# Patient Record
Sex: Female | Born: 1965 | Race: White | Hispanic: No | Marital: Married | Smoking: Never smoker
Health system: Southern US, Community
[De-identification: ages and names within clinical notes are randomized; demographics above are authoritative.]

## PROBLEM LIST (undated history)

## (undated) DIAGNOSIS — E7212 Methylenetetrahydrofolate reductase deficiency: Secondary | ICD-10-CM

## (undated) DIAGNOSIS — M797 Fibromyalgia: Secondary | ICD-10-CM

## (undated) DIAGNOSIS — E7410 Disorder of fructose metabolism, unspecified: Secondary | ICD-10-CM

## (undated) DIAGNOSIS — M249 Joint derangement, unspecified: Secondary | ICD-10-CM

## (undated) DIAGNOSIS — E7211 Homocystinuria: Secondary | ICD-10-CM

## (undated) DIAGNOSIS — M1990 Unspecified osteoarthritis, unspecified site: Secondary | ICD-10-CM

## (undated) DIAGNOSIS — K6389 Other specified diseases of intestine: Secondary | ICD-10-CM

## (undated) DIAGNOSIS — K6289 Other specified diseases of anus and rectum: Secondary | ICD-10-CM

## (undated) DIAGNOSIS — I890 Lymphedema, not elsewhere classified: Secondary | ICD-10-CM

## (undated) DIAGNOSIS — Q279 Congenital malformation of peripheral vascular system, unspecified: Secondary | ICD-10-CM

## (undated) DIAGNOSIS — C50919 Malignant neoplasm of unspecified site of unspecified female breast: Secondary | ICD-10-CM

## (undated) DIAGNOSIS — E039 Hypothyroidism, unspecified: Secondary | ICD-10-CM

## (undated) DIAGNOSIS — K638219 Small intestinal bacterial overgrowth, unspecified: Secondary | ICD-10-CM

## (undated) HISTORY — DX: Other specified diseases of intestine: K63.89

## (undated) HISTORY — PX: KNEE SURGERY: SHX244

## (undated) HISTORY — DX: Other specified diseases of anus and rectum: K62.89

## (undated) HISTORY — PX: OTHER SURGICAL HISTORY: SHX169

## (undated) HISTORY — DX: Small intestinal bacterial overgrowth, unspecified: K63.8219

## (undated) HISTORY — PX: TONSILLECTOMY: SUR1361

## (undated) HISTORY — DX: Homocystinuria: E72.11

## (undated) HISTORY — DX: Disorder of fructose metabolism, unspecified: E74.10

## (undated) HISTORY — DX: Homocystinuria: E72.12

## (undated) HISTORY — DX: Congenital malformation of peripheral vascular system, unspecified: Q27.9

## (undated) HISTORY — PX: CYSTOCELE REPAIR: SHX163

## (undated) HISTORY — PX: RECTOCELE REPAIR: SHX761

---

## 2011-06-26 ENCOUNTER — Emergency Department (HOSPITAL_COMMUNITY): Payer: BC Managed Care – PPO

## 2011-06-26 ENCOUNTER — Emergency Department (HOSPITAL_COMMUNITY)
Admission: EM | Admit: 2011-06-26 | Discharge: 2011-06-26 | Disposition: A | Discharge status: Home / Self Care | Payer: BC Managed Care – PPO | Attending: Emergency Medicine | Admitting: Emergency Medicine

## 2011-06-26 ENCOUNTER — Encounter: Payer: Self-pay | Admitting: Nurse Practitioner

## 2011-06-26 DIAGNOSIS — W19XXXA Unspecified fall, initial encounter: Secondary | ICD-10-CM

## 2011-06-26 DIAGNOSIS — IMO0001 Reserved for inherently not codable concepts without codable children: Secondary | ICD-10-CM | POA: Insufficient documentation

## 2011-06-26 DIAGNOSIS — W108XXA Fall (on) (from) other stairs and steps, initial encounter: Secondary | ICD-10-CM | POA: Insufficient documentation

## 2011-06-26 DIAGNOSIS — E039 Hypothyroidism, unspecified: Secondary | ICD-10-CM | POA: Insufficient documentation

## 2011-06-26 DIAGNOSIS — S5000XA Contusion of unspecified elbow, initial encounter: Secondary | ICD-10-CM | POA: Insufficient documentation

## 2011-06-26 DIAGNOSIS — M533 Sacrococcygeal disorders, not elsewhere classified: Secondary | ICD-10-CM | POA: Insufficient documentation

## 2011-06-26 DIAGNOSIS — M25529 Pain in unspecified elbow: Secondary | ICD-10-CM | POA: Insufficient documentation

## 2011-06-26 DIAGNOSIS — S5001XA Contusion of right elbow, initial encounter: Secondary | ICD-10-CM

## 2011-06-26 DIAGNOSIS — Z79899 Other long term (current) drug therapy: Secondary | ICD-10-CM | POA: Insufficient documentation

## 2011-06-26 DIAGNOSIS — S300XXA Contusion of lower back and pelvis, initial encounter: Secondary | ICD-10-CM | POA: Insufficient documentation

## 2011-06-26 DIAGNOSIS — M129 Arthropathy, unspecified: Secondary | ICD-10-CM | POA: Insufficient documentation

## 2011-06-26 DIAGNOSIS — IMO0002 Reserved for concepts with insufficient information to code with codable children: Secondary | ICD-10-CM | POA: Insufficient documentation

## 2011-06-26 HISTORY — DX: Fibromyalgia: M79.7

## 2011-06-26 HISTORY — DX: Joint derangement, unspecified: M24.9

## 2011-06-26 HISTORY — DX: Unspecified osteoarthritis, unspecified site: M19.90

## 2011-06-26 HISTORY — DX: Hypothyroidism, unspecified: E03.9

## 2011-06-26 MED ORDER — HYDROCODONE-ACETAMINOPHEN 5-325 MG PO TABS
1.0000 | ORAL_TABLET | Freq: Once | ORAL | Status: AC
  Administered 2011-06-26: 1 via ORAL
  Filled 2011-06-26: qty 1

## 2011-06-26 MED ORDER — HYDROCODONE-ACETAMINOPHEN 5-325 MG PO TABS: 1.0000 | ORAL_TABLET | ORAL | Status: AC | PRN

## 2011-06-26 NOTE — ED Notes (Signed)
Pt fell down approx 5 wooden stairs prior to arrival, c/o pelvic, elbow, and lower back pain since. Ambulatory but tearful.

## 2011-06-26 NOTE — ED Provider Notes (Signed)
History     CSN: [id number] Arrival date & time: 06/26/2011  3:22 PM   First MD Initiated Contact with Patient 06/26/11 1553      Chief Complaint  Patient presents with  . Fall    (Consider location/radiation/quality/duration/timing/severity/associated sxs/prior treatment) HPI History provided by pt.   Pt was walking down wooden staircase w/ her hands full, slipped and fell on her buttocks and went down 5 steps.  Did not hit her head.  C/o pain in elbows, tailbone and right buttock.  Has taken 2 advil w/ some relief.  She is ambulatory.  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  It dislocated when she fell but was able to reduce w/ help of her daughter.    Past Medical History  Diagnosis Date  . Hypermobile joints   . Arthritis   . Fibromyalgia   . Hypothyroid   . Sleep apnea     History reviewed. No pertinent past surgical history.  History reviewed. No pertinent family history.  History  Substance Use Topics  . Smoking status: Never Smoker   . Smokeless tobacco: Not on file  . Alcohol Use: No    OB History    Grav Para Term Preterm Abortions TAB SAB Ect Mult Living                  Review of Systems  All other systems reviewed and are negative.    Allergies  Aspirin; Benadryl; Compazine; Dairy aid; Gentamycin; Milk-related compounds; Penicillins; Sulfa antibiotics; Versed; and Feldene  Home Medications   Current Outpatient Rx  Name Route Sig Dispense Refill  . CALCIUM + D PO Oral Take 1 tablet by mouth daily.      Marland Kitchen VITAMIN B-12 1000 MCG SL SUBL Sublingual Place 1 tablet under the tongue once a week.      . CYCLOBENZAPRINE HCL 10 MG PO TABS Oral Take 10 mg by mouth daily as needed.      Marland Kitchen LEVOTHYROXINE SODIUM 75 MCG PO TABS Oral Take 75-112 mcg by mouth daily. 1 1/2 tablet on Mondays, 1 tablet every other day of the week     . THERA M PLUS PO TABS Oral Take 1 tablet by mouth daily. Women's one a day with iron     .  POLYETHYLENE GLYCOL 3350 PO PACK Oral Take 17 g by mouth daily.      . ALBUTEROL SULFATE HFA 108 (90 BASE) MCG/ACT IN AERS Inhalation Inhale 2 puffs into the lungs every 4 (four) hours as needed.        BP 122/83  Pulse 85  Temp(Src) 98 F (36.7 C) (Oral)  Resp 20  Ht 5\' 4"  (1.626 m)  Wt 171 lb (77.565 kg)  BMI 29.35 kg/m2  SpO2 97%  Physical Exam  Nursing note and vitals reviewed. Constitutional: She is oriented to person, place, and time. She appears well-developed and well-nourished. No distress.  HENT:  Head: Normocephalic and atraumatic.  Eyes:       Normal appearance  Neck: Normal range of motion.  Musculoskeletal:       Left and right posterior elbows w/ superficial, hemostatic abrasions.  Tenderness of bilateral medial epicondyles.  Full active ROM of left elbow w/out pain.  Right elbow pain w/ pronation.  Pelvis stable.  Spine non-tender.  Sacrum, coccyx and bilateral SI joints ttp.  Pain in right buttock w/ flexion of both hips.  No pain w/ internal/external rotation of hips.  2+ DP pulses and intact sensation bilaterally.  Neurological: She is alert and oriented to person, place, and time.  Psychiatric: She has a normal mood and affect. Her behavior is normal.    ED Course  Procedures (including critical care time)  Labs Reviewed - No data to display Dg Pelvis 1-2 Views  06/26/2011  *RADIOLOGY REPORT*  Clinical Data: Larey Seat down a flight of stairs, pain at pelvis  PELVIS - 1-2 VIEW  Comparison: None  Findings: IUD projects over sacrum. Hip and SI joints symmetric. Osseous mineralization normal. No acute fracture, dislocation or bone destruction.  IMPRESSION: No acute bony abnormalities.  Original Report Authenticated By: Lollie Marrow, M.D.   Dg Sacrum/coccyx  06/26/2011  *RADIOLOGY REPORT*  Clinical Data: Pelvic pain, fell down stairs  SACRUM AND COCCYX - 2+ VIEW  Comparison: None  Findings: IUD projects over sacrum. Symmetric hip and SI joints. Osseous mineralization  normal. Sacral neural foramen appear grossly symmetric. No acute fracture, dislocation, or bone destruction.  IMPRESSION: No acute bony abnormalities.  Original Report Authenticated By: Lollie Marrow, M.D.   Dg Elbow Complete Right  06/26/2011  *RADIOLOGY REPORT*  Clinical Data: Right elbow pain and abrasions, fell down stairs  RIGHT ELBOW - COMPLETE 3+ VIEW  Comparison: None  Findings: Bone mineralization normal. Joint spaces preserved. No fracture, dislocation, or bone destruction. No joint effusion.  IMPRESSION: Normal exam.  Original Report Authenticated By: Lollie Marrow, M.D.     1. Fall   2. Contusion of coccyx   3. Contusion of right elbow       MDM  Pt has right SI joint laxity.  Had a mechanical fall on buttocks today, SI joint dislocated and she and her daughter reduced at home.  Comes to ED because worse than nml pain at right SI joint as well as tailbone and right elbow.  Xrays neg for fx/dislocation and SI joints appear symmetrical.  Results discussed w/ pt.  She has received 1 po vicodin and reports improvement in pain.  She asked me to contact her PTs in [place] and request f/u for Monday.  Two PT offices called and messages left.  Xray results printed for pt.  Discharged home w/ vicodin for pain.        Otilio Miu, [place] 06/26/11 1824

## 2011-06-26 NOTE — ED Provider Notes (Signed)
Medical screening examination/treatment/procedure(s) were performed by non-physician practitioner and as supervising physician I was immediately available for consultation/collaboration.   Laray Anger, DO 06/26/11 2211

## 2011-06-26 NOTE — ED Notes (Signed)
Patient transported to X-ray 

## 2011-06-26 NOTE — ED Notes (Signed)
Pt c/o of neck pain when radiology tech went in room, [place], [place] made aware and does not want cs films done.

## 2014-08-03 ENCOUNTER — Encounter (HOSPITAL_COMMUNITY): Payer: Self-pay | Admitting: *Deleted

## 2014-08-03 ENCOUNTER — Inpatient Hospital Stay (HOSPITAL_COMMUNITY)
Admission: AD | Admit: 2014-08-03 | Discharge: 2014-08-03 | Disposition: A | Discharge status: Home / Self Care | Payer: PRIVATE HEALTH INSURANCE | Source: Ambulatory Visit | Attending: Obstetrics and Gynecology | Admitting: Obstetrics and Gynecology

## 2014-08-03 DIAGNOSIS — R338 Other retention of urine: Secondary | ICD-10-CM | POA: Diagnosis not present

## 2014-08-03 DIAGNOSIS — R339 Retention of urine, unspecified: Secondary | ICD-10-CM | POA: Diagnosis not present

## 2014-08-03 DIAGNOSIS — K5900 Constipation, unspecified: Secondary | ICD-10-CM

## 2014-08-03 DIAGNOSIS — E039 Hypothyroidism, unspecified: Secondary | ICD-10-CM | POA: Diagnosis not present

## 2014-08-03 DIAGNOSIS — N9989 Other postprocedural complications and disorders of genitourinary system: Secondary | ICD-10-CM | POA: Diagnosis not present

## 2014-08-03 DIAGNOSIS — M797 Fibromyalgia: Secondary | ICD-10-CM | POA: Diagnosis not present

## 2014-08-03 LAB — URINALYSIS, ROUTINE W REFLEX MICROSCOPIC
Bilirubin Urine: NEGATIVE
GLUCOSE, UA: NEGATIVE mg/dL
Ketones, ur: NEGATIVE mg/dL
LEUKOCYTES UA: NEGATIVE
Nitrite: NEGATIVE
PH: 7.5 (ref 5.0–8.0)
PROTEIN: NEGATIVE mg/dL
SPECIFIC GRAVITY, URINE: 1.01 (ref 1.005–1.030)
Urobilinogen, UA: 0.2 mg/dL (ref 0.0–1.0)

## 2014-08-03 LAB — URINE MICROSCOPIC-ADD ON

## 2014-08-03 MED ORDER — ONDANSETRON HCL 4 MG PO TABS
8.0000 mg | ORAL_TABLET | Freq: Once | ORAL | Status: AC
  Administered 2014-08-03: 8 mg via ORAL
  Filled 2014-08-03: qty 2

## 2014-08-03 NOTE — MAU Note (Signed)
Pt had surgery Monday in [place] and went home with catheter. Cath taken out Thursday. Has not had bowel movement since Sunday. Still having trouble urinating and has burning with urination.

## 2014-08-03 NOTE — Discharge Instructions (Signed)
Acute Urinary Retention °Acute urinary retention is the temporary inability to urinate. This is an uncommon problem in women. It can be caused by: °· Infection. °· A side effect of a medicine. °· A problem in a nearby organ that presses or squeezes on the bladder or the urethra (the tube that drains the bladder). °· Psychological problems. °·  Surgery on your bladder, urethra, or pelvic organs that causes obstruction to the outflow of urine from your bladder. °HOME CARE INSTRUCTIONS  °If you are sent home with a Foley catheter and a drainage system, you will need to discuss the best course of action with your health care provider. While the catheter is in, maintain a good intake of fluids. Keep the drainage bag emptied and lower than your catheter. This is so that contaminated urine will not flow back into your bladder, which could lead to a urinary tract infection. °There are two main types of drainage bags. One is a large bag that usually is used at night. It has a good capacity that will allow you to sleep through the night without having to empty it. The second type is called a leg bag. It has a smaller capacity so it needs to be emptied more frequently. However, the main advantage is that it can be attached by a leg strap and goes underneath your clothing, allowing you the freedom to move about or leave your home. °Only take over-the-counter or prescription medicines for pain, discomfort, or fever as directed by your health care provider.  °SEEK MEDICAL CARE IF: °· You develop a low-grade fever. °· You experience spasms or leakage of urine with the spasms. °SEEK IMMEDIATE MEDICAL CARE IF:  °· You develop chills or fever. °· Your catheter stops draining urine. °· Your catheter falls out. °· You start to develop increased bleeding that does not respond to rest and increased fluid intake. °MAKE SURE YOU: °· Understand these instructions. °· Will watch your condition. °· Will get help right away if you are not  doing well or get worse. °Document Released: 07/18/2006 Document Revised: 05/09/2013 Document Reviewed: 12/28/2012 °ExitCare® Patient Information ©2015 [employer]. This information is not intended to replace advice given to you by your health care provider. Make sure you discuss any questions you have with your health care provider. ° °

## 2014-08-03 NOTE — MAU Note (Signed)
Pt up to bathroom, voided 200 cc clear yellow urine

## 2014-08-03 NOTE — MAU Provider Note (Signed)
History     CSN: [id number]  Arrival date and time: 08/03/14 1341   First Provider Initiated Contact with Patient 08/03/14 1532      No chief complaint on file.  HPI Vanessa Porter 49 y.o. had Cystocele, Rectocele, Anterior Colporraphy, Posterior Coplorrhaphy were perfomed on 07/29/14 at [facility] in [place].  Since that time she has had difficulty urinating and having bowel movements since that time.  She is to see Dr. Deneise Lever on 08/06/14 and Dr. Windell Norfolk talked to her today by phone.  Dr. Marylou Mccoy advised pt to be seen at Urgent Care for catheterization and if greater 250cc residual volume, she would like a foley cath to be left in.  She has also asked for urine culture.  She has asked to be called to discuss: [phone] Vanessa Porter).  Since surgery she has taken Miralax twice daily, used Dulcolax suppository once on 12/31.  She has had a very small amount of stool leave her.   She denies weakness, dizziness, headache, shortness of breath, calf pain.  She has nausea with use of pain medication.   OB History    Gravida Para Term Preterm AB TAB SAB Ectopic Multiple Living   3 3        3       Past Medical History  Diagnosis Date  . Hypermobile joints   . Arthritis   . Fibromyalgia   . Hypothyroid     Past Surgical History  Procedure Laterality Date  . Rectocele repair    . Vaginal sling    . Cystocele repair    . Knee surgery    . Tonsillectomy      History reviewed. No pertinent family history.  History  Substance Use Topics  . Smoking status: Never Smoker   . Smokeless tobacco: Never Used  . Alcohol Use: No    Allergies:  Allergies  Allergen Reactions  . Aspirin Anaphylaxis  . Benadryl [Diphenhydramine Hcl] Anaphylaxis  . Clindamycin/Lincomycin Anaphylaxis  . Compazine Anaphylaxis  . Fructose Anaphylaxis  . Midazolam Hcl Anaphylaxis  . Amino Acids Other (See Comments)    Pt is avoiding these in her diet.   . Dairy Aid [Lactase] Other (See Comments)    Reaction:   Stomach upset   . Doxycycline Other (See Comments)    Reaction:  Vision issues   . Indomethacin Other (See Comments)    Reaction:  Headaches that caused white matter in the brain to change.   . Pneumococcal Vaccines Swelling  . Sulfa Antibiotics Hives  . Feldene [Piroxicam] Rash  . Latex Rash    Prescriptions prior to admission  Medication Sig Dispense Refill Last Dose  . amoxicillin-clavulanate (AUGMENTIN) 500-125 MG per tablet Take 1 tablet by mouth 2 (two) times daily. Pt alternates between this and Xifaxan.   Past Month at Unknown time  . bisacodyl (DULCOLAX) 10 MG suppository Place 10 mg rectally as needed for moderate constipation.   08/02/2014 at Unknown time  . cetirizine (ZYRTEC) 10 MG tablet Take 10 mg by mouth daily.   Past Week at Unknown time  . Cholecalciferol (VITAMIN D3) 1000 UNITS CAPS Take 1 capsule by mouth daily.   Past Month at Unknown time  . fluticasone (FLOVENT HFA) 44 MCG/ACT inhaler Inhale 1 puff into the lungs 2 (two) times daily as needed (for shortness of breath).   rescue  . ibuprofen (ADVIL,MOTRIN) 600 MG tablet Take 600 mg by mouth every 6 (six) hours as needed for mild pain.   08/03/2014  at 1200  . Levonorgestrel (MIRENA IU) 1 each by Intrauterine route once.   continuous  . levothyroxine (SYNTHROID, LEVOTHROID) 88 MCG tablet Take 88 mcg by mouth daily before breakfast.   08/03/2014 at Unknown time  . metaxalone (SKELAXIN) 800 MG tablet Take 800 mg by mouth 2 (two) times daily as needed for muscle spasms.   08/02/2014 at Unknown time  . mometasone (NASONEX) 50 MCG/ACT nasal spray Place 2 sprays into the nose 2 (two) times daily.   08/02/2014 at Unknown time  . Multiple Vitamins-Minerals (MULTIVITAMIN & MINERAL PO) Take 1 tablet by mouth daily.   08/02/2014 at Unknown time  . ondansetron (ZOFRAN) 4 MG tablet Take 4 mg by mouth every 8 (eight) hours as needed for nausea or vomiting.   08/03/2014 at Unknown time  . oxyCODONE-acetaminophen (PERCOCET/ROXICET) 5-325 MG per  tablet Take 1 tablet by mouth every 6 (six) hours as needed for severe pain.   08/03/2014 at 0830  . polyethylene glycol powder (GLYCOLAX/MIRALAX) powder Take 17 g by mouth 2 (two) times daily.   08/02/2014 at Unknown time  . rifaximin (XIFAXAN) 550 MG TABS tablet Take 550 mg by mouth 2 (two) times daily. Pt alternates between this and Augmentin.     Marland Kitchen saccharomyces boulardii (FLORASTOR) 250 MG capsule Take 250 mg by mouth daily.   08/03/2014 at Unknown time  . albuterol (PROVENTIL HFA;VENTOLIN HFA) 108 (90 BASE) MCG/ACT inhaler Inhale 2 puffs into the lungs every 6 (six) hours as needed for wheezing or shortness of breath.    rescue    ROS Pertinent ROS in HPI Physical Exam   There were no vitals taken for this visit.  Physical Exam  Constitutional: She is oriented to person, place, and time. She appears well-developed and well-nourished.  HENT:  Head: Normocephalic and atraumatic.  Eyes: EOM are normal.  Neck: Normal range of motion. Neck supple.  Cardiovascular: Normal rate and regular rhythm.   GI: Bowel sounds are normal. She exhibits distension. There is tenderness. There is no rebound and no guarding.  Musculoskeletal: Normal range of motion.  Neurological: She is alert and oriented to person, place, and time.  Skin: Skin is warm and dry.  Psychiatric: She has a normal mood and affect.   Results for orders placed or performed during the hospital encounter of 08/03/14 (from the past 24 hour(s))  Urinalysis, Routine w reflex microscopic     Status: Abnormal   Collection Time: 08/03/14  2:36 PM  Result Value Ref Range   Color, Urine YELLOW YELLOW   APPearance CLEAR CLEAR   Specific Gravity, Urine 1.010 1.005 - 1.030   pH 7.5 5.0 - 8.0   Glucose, UA NEGATIVE NEGATIVE mg/dL   Hgb urine dipstick TRACE (A) NEGATIVE   Bilirubin Urine NEGATIVE NEGATIVE   Ketones, ur NEGATIVE NEGATIVE mg/dL   Protein, ur NEGATIVE NEGATIVE mg/dL   Urobilinogen, UA 0.2 0.0 - 1.0 mg/dL   Nitrite NEGATIVE  NEGATIVE   Leukocytes, UA NEGATIVE NEGATIVE  Urine microscopic-add on     Status: Abnormal   Collection Time: 08/03/14  2:36 PM  Result Value Ref Range   WBC, UA 0-2 <3 WBC/hpf   RBC / HPF 0-2 <3 RBC/hpf   Bacteria, UA FEW (A) RARE    MAU Course  Procedures  MDM Discussed with Dr. Elly Modena whom advises for Foley Cath after void.  If >250cc okay to leave in place for discharge and close follow up in clinic asap with her surgeon.  Additionally, Dr.  Constant advises would not be any more aggressive with constipation management but rather to minimize pain medication as much as able.   Post Void cath residual of 650cc.  Pt to be discharged with foley in place.  Assessment and Plan  A: Post op urinary retention Post op Constipation complicated by pain medication  P: Discharge to home Follow up in clinic asap Continue meds prescribed post op in [place] Return to ED for worsening of sxs   Paticia Stack 08/03/2014, 3:38 PM

## 2014-08-04 ENCOUNTER — Encounter (HOSPITAL_COMMUNITY): Payer: Self-pay | Admitting: *Deleted

## 2014-08-04 ENCOUNTER — Emergency Department (HOSPITAL_COMMUNITY)
Admission: EM | Admit: 2014-08-04 | Discharge: 2014-08-04 | Disposition: A | Discharge status: Home / Self Care | Payer: PRIVATE HEALTH INSURANCE | Attending: Emergency Medicine | Admitting: Emergency Medicine

## 2014-08-04 DIAGNOSIS — R42 Dizziness and giddiness: Secondary | ICD-10-CM | POA: Diagnosis not present

## 2014-08-04 DIAGNOSIS — Z7952 Long term (current) use of systemic steroids: Secondary | ICD-10-CM | POA: Diagnosis not present

## 2014-08-04 DIAGNOSIS — Z9889 Other specified postprocedural states: Secondary | ICD-10-CM | POA: Diagnosis not present

## 2014-08-04 DIAGNOSIS — Z8739 Personal history of other diseases of the musculoskeletal system and connective tissue: Secondary | ICD-10-CM | POA: Insufficient documentation

## 2014-08-04 DIAGNOSIS — Z792 Long term (current) use of antibiotics: Secondary | ICD-10-CM | POA: Diagnosis not present

## 2014-08-04 DIAGNOSIS — Z91040 Latex allergy status: Secondary | ICD-10-CM | POA: Diagnosis not present

## 2014-08-04 DIAGNOSIS — Z978 Presence of other specified devices: Secondary | ICD-10-CM

## 2014-08-04 DIAGNOSIS — R51 Headache: Secondary | ICD-10-CM | POA: Diagnosis not present

## 2014-08-04 DIAGNOSIS — Z79899 Other long term (current) drug therapy: Secondary | ICD-10-CM | POA: Diagnosis not present

## 2014-08-04 DIAGNOSIS — R358 Other polyuria: Secondary | ICD-10-CM | POA: Insufficient documentation

## 2014-08-04 DIAGNOSIS — R3589 Other polyuria: Secondary | ICD-10-CM

## 2014-08-04 DIAGNOSIS — E039 Hypothyroidism, unspecified: Secondary | ICD-10-CM | POA: Diagnosis not present

## 2014-08-04 DIAGNOSIS — Z960 Presence of urogenital implants: Secondary | ICD-10-CM

## 2014-08-04 LAB — CBC WITH DIFFERENTIAL/PLATELET
BASOS ABS: 0 10*3/uL (ref 0.0–0.1)
Basophils Relative: 0 % (ref 0–1)
EOS PCT: 7 % — AB (ref 0–5)
Eosinophils Absolute: 0.6 10*3/uL (ref 0.0–0.7)
HCT: 35.8 % — ABNORMAL LOW (ref 36.0–46.0)
Hemoglobin: 11.8 g/dL — ABNORMAL LOW (ref 12.0–15.0)
LYMPHS PCT: 24 % (ref 12–46)
Lymphs Abs: 2.1 10*3/uL (ref 0.7–4.0)
MCH: 29.6 pg (ref 26.0–34.0)
MCHC: 33 g/dL (ref 30.0–36.0)
MCV: 89.9 fL (ref 78.0–100.0)
Monocytes Absolute: 0.5 10*3/uL (ref 0.1–1.0)
Monocytes Relative: 5 % (ref 3–12)
NEUTROS PCT: 64 % (ref 43–77)
Neutro Abs: 5.8 10*3/uL (ref 1.7–7.7)
PLATELETS: 294 10*3/uL (ref 150–400)
RBC: 3.98 MIL/uL (ref 3.87–5.11)
RDW: 13 % (ref 11.5–15.5)
WBC: 9 10*3/uL (ref 4.0–10.5)

## 2014-08-04 LAB — BASIC METABOLIC PANEL
ANION GAP: 5 (ref 5–15)
BUN: 13 mg/dL (ref 6–23)
CALCIUM: 9.5 mg/dL (ref 8.4–10.5)
CO2: 29 mmol/L (ref 19–32)
Chloride: 105 mEq/L (ref 96–112)
Creatinine, Ser: 0.71 mg/dL (ref 0.50–1.10)
GFR calc Af Amer: >90 mL/min (ref >90)
Glucose, Bld: 109 mg/dL — ABNORMAL HIGH (ref 70–99)
POTASSIUM: 3.6 mmol/L (ref 3.5–5.1)
SODIUM: 139 mmol/L (ref 135–145)

## 2014-08-04 MED ORDER — SODIUM CHLORIDE 0.9 % IV BOLUS (SEPSIS): 1000.0000 mL | Freq: Once | INTRAVENOUS | Status: DC

## 2014-08-04 MED ORDER — METOCLOPRAMIDE HCL 10 MG PO TABS: 10.0000 mg | ORAL_TABLET | Freq: Four times a day (QID) | ORAL | Status: DC

## 2014-08-04 MED ORDER — METOCLOPRAMIDE HCL 5 MG/ML IJ SOLN
10.0000 mg | Freq: Once | INTRAMUSCULAR | Status: AC
  Administered 2014-08-04: 10 mg via INTRAMUSCULAR
  Filled 2014-08-04: qty 2

## 2014-08-04 MED ORDER — SODIUM CHLORIDE 0.9 % IV BOLUS (SEPSIS)
1000.0000 mL | Freq: Once | INTRAVENOUS | Status: AC
  Administered 2014-08-04: 1000 mL via INTRAVENOUS

## 2014-08-04 MED ORDER — METOCLOPRAMIDE HCL 5 MG/ML IJ SOLN
10.0000 mg | Freq: Once | INTRAMUSCULAR | Status: DC
  Filled 2014-08-04: qty 2

## 2014-08-04 MED ORDER — MORPHINE SULFATE 4 MG/ML IJ SOLN
4.0000 mg | Freq: Once | INTRAMUSCULAR | Status: AC
  Administered 2014-08-04: 4 mg via INTRAVENOUS
  Filled 2014-08-04: qty 1

## 2014-08-04 MED ORDER — ONDANSETRON HCL 4 MG/2ML IJ SOLN
4.0000 mg | INTRAMUSCULAR | Status: AC
  Administered 2014-08-04: 4 mg via INTRAVENOUS
  Filled 2014-08-04: qty 2

## 2014-08-04 NOTE — ED Notes (Signed)
Pt assisted with getting pants on by this RN. Catheter securement also changed per patients request. She was assisted to car by NT Edison Nasuti where a friend is driving her home. Patient was advised to follow up with PCP in 2 days and keep all other follow up appointments.

## 2014-08-04 NOTE — ED Notes (Signed)
Pt states she has taken in 600cc fluid but put out over 3000.

## 2014-08-04 NOTE — ED Notes (Signed)
Bladder sling and cystocele repair done Monday 12/28 Went back for f/u on Weds. (voiding study where foley was removed) 12/30 Decreased output since foley taken out, so patient was told to go to Urgent Care and then UC told to go to [facility]  At [facility], another foley cath was placed (08/03/2014) Patient now here in ED because she believes that "too much urine is coming out, that she is going to get dehydrated and her electrolytes are going to be messed up"

## 2014-08-04 NOTE — ED Notes (Signed)
Pt had bladder surgery on Monday at [facility]. Pt in today b/c she states she is voiding too much.

## 2014-08-04 NOTE — ED Notes (Signed)
Bed: AP01 Expected date:  Expected time:  Means of arrival:  Comments: Bed 5, EMS, 87 F, Excessive Urine Output

## 2014-08-04 NOTE — Discharge Instructions (Signed)
Urethral Vaginal Sling °A urethral vaginal sling procedure is surgery to correct urinary incontinence. Urinary incontinence is uncontrolled loss of urine. It is common in women who have had children and in older women. In this surgery, a strong piece of material is placed under the tube that drains the bladder (urethra). This sling is made of tension-free vaginal tape or nylon mesh. It fits under the urethra like a hammock. The sling is put in position to straighten, support, and hold the urethra in its normal position.  °LET YOUR HEALTH CARE PROVIDER KNOW ABOUT:  °· Any allergies you have. °· All medicines you are taking, including vitamins, herbs, eye drops, creams, and over-the-counter medicines. °· Previous problems you or members of your family have had with the use of anesthetics. °· Any blood disorders you have. °· Previous surgeries you have had. °· Medical conditions you have. °RISKS AND COMPLICATIONS  °Generally, this is a safe procedure. However, as with any procedure, complications can occur. Possible complications include: °· Infection. °· Excessive bleeding. °· Damage to other organs. °· Problems urinating properly for several days or weeks. °· Problems from the use of anesthetics. °· Return of the urinary incontinence. °BEFORE THE PROCEDURE  °· Ask your health care provider about changing or stopping your regular medicines. You may need to stop taking certain medicines 1 week before the surgery. °· Do not eat or drink anything for 6-8 hours before the surgery. °· If you smoke, do not smoke for at least 2 weeks before the surgery. °· Make plans to have someone drive you home after your hospital stay. Also arrange for someone to help you with activities during recovery. °PROCEDURE  °· You will have general or spinal anesthesia. With general anesthesia, you are asleep and will feel no pain. With spinal anesthesia, you are numb from the waist down, but you will still be awake. °· A catheter is placed in  your bladder to drain urine during the procedure. °· An incision is made in your vagina and low on your belly in the hairline. °· The sling material is passed around your bladder neck and sutured to the muscles to hold the urethra in its normal position. °· The incisions are closed. °AFTER THE PROCEDURE  °· You will be taken to a recovery area where your progress will be monitored closely. Your breathing, blood pressure, and pulse (vital signs) will be checked often. When you are stable, you will be moved to a regular hospital room. °· You will have a catheter in place to drain your bladder. This will stay in place until your bladder is working properly on its own. °· You may have a gauze packing in the vagina to prevent bleeding. This will be removed in 1-2 days. °· You will likely need to stay in the hospital for 2-3 days. °Document Released: 04/27/2008 Document Revised: 05/09/2013 Document Reviewed: 01/05/2013 °ExitCare® Patient Information ©2015 [employer]. This information is not intended to replace advice given to you by your health care provider. Make sure you discuss any questions you have with your health care provider. ° °

## 2014-08-04 NOTE — ED Notes (Signed)
Pt also c/o nausea and a h/a

## 2014-08-04 NOTE — ED Provider Notes (Signed)
CSN: [id number]     Arrival date & time 08/04/14  0215 History   First MD Initiated Contact with Patient 08/04/14 0308     Chief Complaint  Patient presents with  . Polyuria     (Consider location/radiation/quality/duration/timing/severity/associated sxs/prior Treatment) HPI Comments: Patient is a 49 year old female who presents to the emergency department for further evaluation of polyuria. Patient states that she had a rectocele and cystocele as well as incontinence and, therefore, had bladder sling procedure completed on 07/29/2014 by Dr. Windell Norfolk and Dr. Deneise Lever. Surgery was performed at [facility]. Patient states that she couldn't void after surgery requiring subsequent placement of a Foley catheter. Patient states that she had her Foley catheter removed after bladder challenge on 07/31/2014. She states that she noticed increasing abdominal distention with difficulty voiding over the last few days. She called her doctor who recommended that she go to the emergency department for evaluation. Patient presented to [facility] yesterday and was found to have a PVD of 680cc. A new Foley catheter was placed at this time. Patient was not started on antibiotics as urinalysis did not suggest infection. Patient states that she has had approximately 3800cc fluid in her catheter bag since Foley placement. She states that she is concerned about this because she has only drank approximately 800cc of fluid. She states that she has felt intermittently nauseous with a headache and dry heaves. She states that she has persistent abdominal pain since the surgery which is present in her suprapubic region. Patient denies associated fever, syncope, chest pain, and shortness of breath. She has been having some irregular bowel movements and passing flatus. Her last bowel movement was yesterday.  The history is provided by the patient. No language interpreter was used.    Past Medical History  Diagnosis Date  .  Hypermobile joints   . Arthritis   . Fibromyalgia   . Hypothyroid    Past Surgical History  Procedure Laterality Date  . Rectocele repair    . Vaginal sling    . Cystocele repair    . Knee surgery    . Tonsillectomy     History reviewed. No pertinent family history. History  Substance Use Topics  . Smoking status: Never Smoker   . Smokeless tobacco: Never Used  . Alcohol Use: No   OB History    Gravida Para Term Preterm AB TAB SAB Ectopic Multiple Living   3 3        3       Review of Systems  Constitutional: Negative for fever.  Respiratory: Negative for shortness of breath.   Cardiovascular: Negative for chest pain.  Gastrointestinal: Positive for nausea and abdominal pain.  Neurological: Positive for light-headedness and headaches. Negative for syncope.  All other systems reviewed and are negative.   Allergies  Aspirin; Benadryl; Clindamycin/lincomycin; Compazine; Fructose; Midazolam hcl; Amino acids; Dairy aid; Doxycycline; Indomethacin; Pneumococcal vaccines; Sulfa antibiotics; Feldene; and Latex  Home Medications   Prior to Admission medications   Medication Sig Start Date End Date Taking? Authorizing Provider  albuterol (PROVENTIL HFA;VENTOLIN HFA) 108 (90 BASE) MCG/ACT inhaler Inhale 2 puffs into the lungs every 6 (six) hours as needed for wheezing or shortness of breath.    Yes Historical Provider, MD  amoxicillin-clavulanate (AUGMENTIN) 500-125 MG per tablet Take 1 tablet by mouth 2 (two) times daily. Pt alternates between this and xifaxan   Yes Historical Provider, MD  bisacodyl (DULCOLAX) 10 MG suppository Place 10 mg rectally daily as needed for moderate constipation.  08/01/14 08/11/14 Yes Historical Provider, MD  cetirizine (ZYRTEC) 10 MG tablet Take 10 mg by mouth daily.   Yes Historical Provider, MD  Cholecalciferol (VITAMIN D3) 1000 UNITS CAPS Take 1,000 Units by mouth daily.    Yes Historical Provider, MD  fluticasone (FLOVENT HFA) 44 MCG/ACT inhaler  Inhale 1 puff into the lungs 2 (two) times daily as needed (for shortness of breath).   Yes Historical Provider, MD  HYDROcodone-acetaminophen (NORCO) 5-325 MG per tablet Take 1 tablet by mouth every 6 (six) hours as needed for moderate pain.  07/29/14 08/08/14 Yes Historical Provider, MD  ibuprofen (ADVIL,MOTRIN) 600 MG tablet Take 600 mg by mouth every 6 (six) hours as needed for mild pain.   Yes Historical Provider, MD  Levonorgestrel (MIRENA IU) 1 each by Intrauterine route once. 02/14/13  Yes Historical Provider, MD  levothyroxine (SYNTHROID, LEVOTHROID) 88 MCG tablet Take 88 mcg by mouth daily before breakfast.   Yes Historical Provider, MD  metaxalone (SKELAXIN) 800 MG tablet Take 800 mg by mouth 2 (two) times daily as needed for muscle spasms.   Yes Historical Provider, MD  mometasone (NASONEX) 50 MCG/ACT nasal spray Place 2 sprays into the nose 2 (two) times daily.   Yes Historical Provider, MD  Multiple Vitamins-Minerals (MULTIVITAMIN & MINERAL PO) Take 1 tablet by mouth daily.   Yes Historical Provider, MD  ondansetron (ZOFRAN) 4 MG tablet Take 4 mg by mouth every 8 (eight) hours as needed for nausea or vomiting.   Yes Historical Provider, MD  oxyCODONE-acetaminophen (PERCOCET/ROXICET) 5-325 MG per tablet Take 1 tablet by mouth every 6 (six) hours as needed for severe pain.   Yes Historical Provider, MD  polyethylene glycol powder (GLYCOLAX/MIRALAX) powder Take 17 g by mouth 2 (two) times daily.   Yes Historical Provider, MD  rifaximin (XIFAXAN) 550 MG TABS tablet Take 550 mg by mouth 2 (two) times daily. Pt alternates between this and Augmentin.   Yes Historical Provider, MD  saccharomyces boulardii (FLORASTOR) 250 MG capsule Take 250 mg by mouth daily.   Yes Historical Provider, MD  metoCLOPramide (REGLAN) 10 MG tablet Take 1 tablet (10 mg total) by mouth every 6 (six) hours. 08/04/14   Antonietta Breach, PA-C   BP 104/67 mmHg  Pulse 66  Temp(Src) 98.1 F (36.7 C) (Oral)  Resp 20  SpO2 96%   LMP 07/01/2014   Physical Exam  Constitutional: She is oriented to person, place, and time. She appears well-developed and well-nourished. No distress.  Nontoxic/nonseptic appearing  HENT:  Head: Normocephalic and atraumatic.  Eyes: Conjunctivae and EOM are normal. No scleral icterus.  Neck: Normal range of motion.  Pulmonary/Chest: Effort normal. No respiratory distress.  Chest expansion symmetric  Abdominal: Normal appearance and bowel sounds are normal. There is tenderness in the suprapubic area. There is no rebound and no CVA tenderness.    No peritoneal signs or guarding. Abdomen soft.  Musculoskeletal: Normal range of motion.  Neurological: She is alert and oriented to person, place, and time. She exhibits normal muscle tone. Coordination normal.  GCS 15. Speech is goal oriented. Patient moves extremities without ataxia.  Skin: Skin is warm and dry. No rash noted. She is not diaphoretic. No erythema. No pallor.  Psychiatric: She has a normal mood and affect. Her behavior is normal.  Nursing note and vitals reviewed.   ED Course  Procedures (including critical care time) Labs Review Labs Reviewed  CBC WITH DIFFERENTIAL - Abnormal; Notable for the following:    Hemoglobin 11.8 (*)  HCT 35.8 (*)    Eosinophils Relative 7 (*)    All other components within normal limits  BASIC METABOLIC PANEL - Abnormal; Notable for the following:    Glucose, Bld 109 (*)    All other components within normal limits    Imaging Review No results found.   EKG Interpretation None      MDM   Final diagnoses:  Polyuria  Foley catheter in place    49 year old female presents to the emergency department for further evaluation of polyuria. Patient concerned that she has put out about 3800cc of fluid in her foley catheter bag in 12 hours despite only taking in 800cc PO. Patient has no clinical signs of dehydration. No tachycardia or persistent hypotension. Laboratory workup reveals no  leukocytosis. She has a normal kidney function without evidence of AKI. Abdominal exam significant for some mild tenderness in the suprapubic region. Believe this is appropriate given patient's recent surgical procedure. No peritoneal signs, masses, or other evidence of acute surgical abdomen.   I have reviewed the patient's workup with Dr. Windell Norfolk. Dr. Windell Norfolk believes the patient is stable for outpatient follow-up with Dr. Deneise Lever as scheduled. She does not see the need for further emergent workup or imaging at this time. Have reviewed these findings with the patient as well; she verbalizes understanding. Will discharge with prescription for Reglan for persistent nausea if not controlled with Zofran. Return precautions discussed and provided. Patient agreeable to plan with no unaddressed concerns.   Filed Vitals:   08/04/14 0430 08/04/14 0500 08/04/14 0700 08/04/14 0721  BP: 120/78 118/72 99/62 104/67  Pulse: 67 86 59 66  Temp:    98.1 F (36.7 C)  TempSrc:    Oral  Resp:    20  SpO2: 97% 94% 96% 96%     Antonietta Breach, PA-C 08/04/14 [place], MD 08/05/14 [phone]

## 2014-08-04 NOTE — ED Notes (Signed)
Patient is resting comfortably. Warm blanket and pillow given.

## 2019-03-04 ENCOUNTER — Encounter (HOSPITAL_COMMUNITY): Payer: Self-pay | Admitting: Emergency Medicine

## 2019-03-04 ENCOUNTER — Other Ambulatory Visit: Payer: Self-pay

## 2019-03-04 ENCOUNTER — Ambulatory Visit (HOSPITAL_COMMUNITY)
Admission: EM | Admit: 2019-03-04 | Discharge: 2019-03-04 | Disposition: A | Discharge status: Home / Self Care | Payer: Managed Care, Other (non HMO) | Attending: Family Medicine | Admitting: Family Medicine

## 2019-03-04 DIAGNOSIS — R42 Dizziness and giddiness: Secondary | ICD-10-CM | POA: Diagnosis present

## 2019-03-04 DIAGNOSIS — Z20828 Contact with and (suspected) exposure to other viral communicable diseases: Secondary | ICD-10-CM | POA: Diagnosis present

## 2019-03-04 DIAGNOSIS — C50911 Malignant neoplasm of unspecified site of right female breast: Secondary | ICD-10-CM

## 2019-03-04 DIAGNOSIS — R51 Headache: Secondary | ICD-10-CM | POA: Diagnosis not present

## 2019-03-04 DIAGNOSIS — S060X9S Concussion with loss of consciousness of unspecified duration, sequela: Secondary | ICD-10-CM | POA: Diagnosis not present

## 2019-03-04 DIAGNOSIS — Z20822 Contact with and (suspected) exposure to covid-19: Secondary | ICD-10-CM

## 2019-03-04 DIAGNOSIS — Z853 Personal history of malignant neoplasm of breast: Secondary | ICD-10-CM

## 2019-03-04 DIAGNOSIS — R519 Headache, unspecified: Secondary | ICD-10-CM

## 2019-03-04 HISTORY — DX: Malignant neoplasm of unspecified site of unspecified female breast: C50.919

## 2019-03-04 NOTE — ED Triage Notes (Signed)
Patient's elderly mother has a fever.  No known covid exposure, but requesting testing

## 2019-03-04 NOTE — ED Notes (Signed)
covid specimen was obtained by Nurse Reita Cliche, not Lonna Duval

## 2019-03-04 NOTE — ED Provider Notes (Signed)
MRN: [id number] DOB: 25-Jul-1966  Subjective:   Vanessa Porter is a 53 y.o. female presenting for screening for COVID-19.  Patient presents with her mother and father both of whom have felt under the weather according to her for the past 1 to 2 weeks.  The patient herself does not feel ill.  But she has had a multitude of health issues this year including severe concussion with need to undergo months of PT.  She also has right sided breast cancer and is undergoing treatment for that.  States that she has had a recurrence of headaches and dizziness that she believes is unrelated to COVID-19 but wants to take precautions in getting tested.   No current facility-administered medications for this encounter.   Current Outpatient Medications:  .  butalbital-acetaminophen-caffeine (FIORICET) 50-325-40 MG tablet, Take by mouth 2 (two) times daily as needed for headache., Disp: , Rfl:  .  cetirizine (ZYRTEC) 10 MG tablet, Take 10 mg by mouth daily., Disp: , Rfl:  .  Cholecalciferol (VITAMIN D3) 1000 UNITS CAPS, Take 1,000 Units by mouth daily. , Disp: , Rfl:  .  Cyanocobalamin (VITAMIN B 12 PO), by Other route. Injection monthly, Disp: , Rfl:  .  Ergocalciferol (VITAMIN D2 PO), Take 100 mg by mouth., Disp: , Rfl:  .  levothyroxine (SYNTHROID, LEVOTHROID) 88 MCG tablet, Take 100 mcg by mouth daily before breakfast. , Disp: , Rfl:  .  magnesium citrate solution, Take 250 mLs by mouth once., Disp: , Rfl:  .  Multiple Vitamins-Minerals (MULTIVITAMIN & MINERAL PO), Take 1 tablet by mouth daily., Disp: , Rfl:  .  albuterol (PROVENTIL HFA;VENTOLIN HFA) 108 (90 BASE) MCG/ACT inhaler, Inhale 2 puffs into the lungs every 6 (six) hours as needed for wheezing or shortness of breath. , Disp: , Rfl:  .  fluticasone (FLOVENT HFA) 44 MCG/ACT inhaler, Inhale 1 puff into the lungs 2 (two) times daily as needed (for shortness of breath)., Disp: , Rfl:  .  ibuprofen (ADVIL,MOTRIN) 600 MG tablet, Take 600 mg by mouth every 6  (six) hours as needed for mild pain., Disp: , Rfl:  .  polyethylene glycol powder (GLYCOLAX/MIRALAX) powder, Take 17 g by mouth 2 (two) times daily., Disp: , Rfl:     Allergies  Allergen Reactions  . Aspirin Anaphylaxis  . Benadryl [Diphenhydramine Hcl] Anaphylaxis  . Clindamycin/Lincomycin Anaphylaxis  . Compazine Anaphylaxis  . Fructose Anaphylaxis  . Midazolam Hcl Anaphylaxis  . Amino Acids Other (See Comments)    Pt is avoiding these in her diet.   . Dairy Aid [Lactase] Other (See Comments)    Reaction:  Stomach upset   . Doxycycline Other (See Comments)    Reaction:  Vision issues   . Indomethacin Other (See Comments)    Reaction:  Headaches that caused white matter in the brain to change.   . Pneumococcal Vaccines Swelling  . Sulfa Antibiotics Hives  . Feldene [Piroxicam] Rash  . Latex Rash    Past Medical History:  Diagnosis Date  . Arthritis   . Fibromyalgia   . Hypermobile joints   . Hypothyroid      Past Surgical History:  Procedure Laterality Date  . CYSTOCELE REPAIR    . KNEE SURGERY    . RECTOCELE REPAIR    . TONSILLECTOMY    . vaginal sling      ROS  Objective:   Vitals: BP 127/75 (BP Location: Left Arm)   Pulse 70   Temp 98.8 F (37.1 C)  LMP 03/02/2019   Physical Exam Constitutional:      General: She is not in acute distress.    Appearance: Normal appearance. She is well-developed. She is not ill-appearing, toxic-appearing or diaphoretic.  HENT:     Head: Normocephalic and atraumatic.     Nose: Nose normal.     Mouth/Throat:     Mouth: Mucous membranes are moist.  Eyes:     Extraocular Movements: Extraocular movements intact.     Pupils: Pupils are equal, round, and reactive to light.  Cardiovascular:     Rate and Rhythm: Normal rate and regular rhythm.     Pulses: Normal pulses.     Heart sounds: Normal heart sounds. No murmur. No friction rub. No gallop.   Pulmonary:     Effort: Pulmonary effort is normal. No respiratory  distress.     Breath sounds: Normal breath sounds. No stridor. No wheezing, rhonchi or rales.  Skin:    General: Skin is warm and dry.     Findings: No rash.  Neurological:     Mental Status: She is alert and oriented to person, place, and time.  Psychiatric:        Mood and Affect: Mood normal.        Behavior: Behavior normal.        Thought Content: Thought content normal.        Judgment: Judgment normal.      Assessment and Plan :   1. Exposure to Covid-19 Virus   2. Generalized headaches   3. Dizziness   4. Concussion with loss of consciousness, sequela ([place])   5. Malignant neoplasm of right female breast, unspecified estrogen receptor status, unspecified site of breast ([place])     Counseled patient on nature of COVID-19 including modes of transmission, diagnostic testing, management and supportive care.  Offered symptomatic relief. COVID 19 testing is pending. Counseled patient on potential for adverse effects with medications prescribed/recommended today, ER and return-to-clinic precautions discussed, patient verbalized understanding.     Jaynee Eagles, PA-C 03/04/19 1139

## 2019-03-05 LAB — NOVEL CORONAVIRUS, NAA (HOSP ORDER, SEND-OUT TO REF LAB; TAT 18-24 HRS): SARS-CoV-2, NAA: NOT DETECTED

## 2020-04-06 ENCOUNTER — Emergency Department (HOSPITAL_BASED_OUTPATIENT_CLINIC_OR_DEPARTMENT_OTHER): Payer: Managed Care, Other (non HMO)

## 2020-04-06 ENCOUNTER — Emergency Department (HOSPITAL_BASED_OUTPATIENT_CLINIC_OR_DEPARTMENT_OTHER)
Admission: EM | Admit: 2020-04-06 | Discharge: 2020-04-07 | Disposition: A | Discharge status: Home / Self Care | Payer: Managed Care, Other (non HMO) | Attending: Emergency Medicine | Admitting: Emergency Medicine

## 2020-04-06 ENCOUNTER — Encounter (HOSPITAL_BASED_OUTPATIENT_CLINIC_OR_DEPARTMENT_OTHER): Payer: Self-pay | Admitting: Emergency Medicine

## 2020-04-06 ENCOUNTER — Other Ambulatory Visit: Payer: Self-pay

## 2020-04-06 ENCOUNTER — Emergency Department (HOSPITAL_COMMUNITY): Payer: Managed Care, Other (non HMO)

## 2020-04-06 DIAGNOSIS — Z91040 Latex allergy status: Secondary | ICD-10-CM | POA: Insufficient documentation

## 2020-04-06 DIAGNOSIS — Y9389 Activity, other specified: Secondary | ICD-10-CM | POA: Diagnosis not present

## 2020-04-06 DIAGNOSIS — S060X0A Concussion without loss of consciousness, initial encounter: Secondary | ICD-10-CM | POA: Insufficient documentation

## 2020-04-06 DIAGNOSIS — Z20822 Contact with and (suspected) exposure to covid-19: Secondary | ICD-10-CM | POA: Insufficient documentation

## 2020-04-06 DIAGNOSIS — W228XXA Striking against or struck by other objects, initial encounter: Secondary | ICD-10-CM | POA: Insufficient documentation

## 2020-04-06 DIAGNOSIS — Z853 Personal history of malignant neoplasm of breast: Secondary | ICD-10-CM | POA: Diagnosis not present

## 2020-04-06 DIAGNOSIS — Y999 Unspecified external cause status: Secondary | ICD-10-CM | POA: Diagnosis not present

## 2020-04-06 DIAGNOSIS — S0990XA Unspecified injury of head, initial encounter: Secondary | ICD-10-CM | POA: Diagnosis present

## 2020-04-06 DIAGNOSIS — Y929 Unspecified place or not applicable: Secondary | ICD-10-CM | POA: Diagnosis not present

## 2020-04-06 HISTORY — DX: Lymphedema, not elsewhere classified: I89.0

## 2020-04-06 LAB — CBC
HCT: 42.4 % (ref 36.0–46.0)
Hemoglobin: 13.7 g/dL (ref 12.0–15.0)
MCH: 28.5 pg (ref 26.0–34.0)
MCHC: 32.3 g/dL (ref 30.0–36.0)
MCV: 88.1 fL (ref 80.0–100.0)
Platelets: 218 10*3/uL (ref 150–400)
RBC: 4.81 MIL/uL (ref 3.87–5.11)
RDW: 13.2 % (ref 11.5–15.5)
WBC: 8.9 10*3/uL (ref 4.0–10.5)
nRBC: 0 % (ref 0.0–0.2)

## 2020-04-06 LAB — BASIC METABOLIC PANEL
Anion gap: 12 (ref 5–15)
BUN: 21 mg/dL — ABNORMAL HIGH (ref 6–20)
CO2: 25 mmol/L (ref 22–32)
Calcium: 9 mg/dL (ref 8.9–10.3)
Chloride: 100 mmol/L (ref 98–111)
Creatinine, Ser: 0.72 mg/dL (ref 0.44–1.00)
GFR calc Af Amer: >60 mL/min (ref >60)
GFR calc non Af Amer: >60 mL/min (ref >60)
Glucose, Bld: 96 mg/dL (ref 70–99)
Potassium: 3.5 mmol/L (ref 3.5–5.1)
Sodium: 137 mmol/L (ref 135–145)

## 2020-04-06 LAB — SARS CORONAVIRUS 2 BY RT PCR (HOSPITAL ORDER, PERFORMED IN ~~LOC~~ HOSPITAL LAB): SARS Coronavirus 2: NEGATIVE

## 2020-04-06 MED ORDER — ACETAMINOPHEN 325 MG PO TABS
650.0000 mg | ORAL_TABLET | Freq: Once | ORAL | Status: AC
  Administered 2020-04-06: 650 mg via ORAL
  Filled 2020-04-06: qty 2

## 2020-04-06 NOTE — ED Notes (Signed)
[facility] notified (Jaime) - patient ready for transport 
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
## 2020-04-06 NOTE — ED Notes (Signed)
Pt agrees with plan for transport to [facility] ED for MRI.  Per Dr Venita Sheffield it will be Dr Ronnald Nian accepting at [facility] ED

## 2020-04-06 NOTE — ED Notes (Signed)
ED Provider at bedside. 
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
## 2020-04-06 NOTE — ED Notes (Signed)
Pt here via [facility] from [facility]. Pt A&O x 4, VSS.

## 2020-04-07 MED ORDER — ONDANSETRON 4 MG PO TBDP: 4.0000 mg | ORAL_TABLET | Freq: Three times a day (TID) | ORAL | 0 refills | Status: DC | PRN

## 2020-04-07 MED ORDER — GADOBUTROL 1 MMOL/ML IV SOLN
7.5000 mL | Freq: Once | INTRAVENOUS | Status: AC | PRN
  Administered 2020-04-07: 7.5 mL via INTRAVENOUS

## 2020-04-07 MED ORDER — ACETAMINOPHEN 325 MG PO TABS
650.0000 mg | ORAL_TABLET | Freq: Once | ORAL | Status: AC
  Administered 2020-04-07: 650 mg via ORAL
  Filled 2020-04-07: qty 2

## 2020-04-07 NOTE — Discharge Instructions (Addendum)
Thank you for allowing me to care for you today in the Emergency Department.   Your MRI did not show an infarct/ bleeding in your brain today.   Neurology reviewed your MRI and did not see any evidence of MS lesions, but you should follow up with your neurologist for a re-evaluation if you continue to have symptoms because your MRI today cannot rule this out.  You should also follow-up if your headache is not well controlled with Tylenol for a headache regimen at home.   You can take 650 mg of Tylenol once every 6 hours or 1000 g of Tylenol once every 8 hours for headaches.  Do not take more than 4000 mg of Tylenol in a 24-hour period.  Let 1 tablet of Zofran dissolve in your tongue every 8 hours as needed for nausea or vomiting.  Return to the ER if you have any fall, head injury, develop new numbness or weakness, passout, have seizure-like activity, persistent changes in your vision, or other new, concerning symptoms.

## 2020-05-04 ENCOUNTER — Emergency Department (HOSPITAL_COMMUNITY): Payer: Managed Care, Other (non HMO)

## 2020-05-04 ENCOUNTER — Other Ambulatory Visit: Payer: Self-pay

## 2020-05-04 ENCOUNTER — Emergency Department (HOSPITAL_COMMUNITY)
Admission: EM | Admit: 2020-05-04 | Discharge: 2020-05-05 | Disposition: A | Discharge status: Home / Self Care | Payer: Managed Care, Other (non HMO) | Attending: Emergency Medicine | Admitting: Emergency Medicine

## 2020-05-04 DIAGNOSIS — Z79890 Hormone replacement therapy (postmenopausal): Secondary | ICD-10-CM | POA: Diagnosis not present

## 2020-05-04 DIAGNOSIS — J45909 Unspecified asthma, uncomplicated: Secondary | ICD-10-CM | POA: Insufficient documentation

## 2020-05-04 DIAGNOSIS — Z853 Personal history of malignant neoplasm of breast: Secondary | ICD-10-CM | POA: Insufficient documentation

## 2020-05-04 DIAGNOSIS — M542 Cervicalgia: Secondary | ICD-10-CM | POA: Insufficient documentation

## 2020-05-04 DIAGNOSIS — R079 Chest pain, unspecified: Secondary | ICD-10-CM | POA: Insufficient documentation

## 2020-05-04 DIAGNOSIS — Z20822 Contact with and (suspected) exposure to covid-19: Secondary | ICD-10-CM | POA: Diagnosis not present

## 2020-05-04 DIAGNOSIS — G8929 Other chronic pain: Secondary | ICD-10-CM | POA: Insufficient documentation

## 2020-05-04 DIAGNOSIS — R519 Headache, unspecified: Secondary | ICD-10-CM | POA: Diagnosis not present

## 2020-05-04 DIAGNOSIS — Z91040 Latex allergy status: Secondary | ICD-10-CM | POA: Insufficient documentation

## 2020-05-04 DIAGNOSIS — E039 Hypothyroidism, unspecified: Secondary | ICD-10-CM | POA: Insufficient documentation
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
## 2020-05-05 LAB — CBC
HCT: 40.7 % (ref 36.0–46.0)
Hemoglobin: 13.4 g/dL (ref 12.0–15.0)
MCH: 29.2 pg (ref 26.0–34.0)
MCHC: 32.9 g/dL (ref 30.0–36.0)
MCV: 88.7 fL (ref 80.0–100.0)
Platelets: 264 10*3/uL (ref 150–400)
RBC: 4.59 MIL/uL (ref 3.87–5.11)
RDW: 13.2 % (ref 11.5–15.5)
WBC: 8.4 10*3/uL (ref 4.0–10.5)
nRBC: 0 % (ref 0.0–0.2)

## 2020-05-05 LAB — RESPIRATORY PANEL BY RT PCR (FLU A&B, COVID)
Influenza A by PCR: NEGATIVE
Influenza B by PCR: NEGATIVE
SARS Coronavirus 2 by RT PCR: NEGATIVE

## 2020-05-05 LAB — BASIC METABOLIC PANEL
Anion gap: 11 (ref 5–15)
BUN: 29 mg/dL — ABNORMAL HIGH (ref 6–20)
CO2: 26 mmol/L (ref 22–32)
Calcium: 9.2 mg/dL (ref 8.9–10.3)
Chloride: 101 mmol/L (ref 98–111)
Creatinine, Ser: 0.87 mg/dL (ref 0.44–1.00)
GFR calc Af Amer: >60 mL/min (ref >60)
GFR calc non Af Amer: >60 mL/min (ref >60)
Glucose, Bld: 102 mg/dL — ABNORMAL HIGH (ref 70–99)
Potassium: 4.3 mmol/L (ref 3.5–5.1)
Sodium: 138 mmol/L (ref 135–145)

## 2020-05-05 LAB — I-STAT BETA HCG BLOOD, ED (MC, WL, AP ONLY): I-stat hCG, quantitative: <5 m[IU]/mL (ref <5)

## 2020-05-05 LAB — TROPONIN I (HIGH SENSITIVITY)
Troponin I (High Sensitivity): <2 ng/L (ref <18)
Troponin I (High Sensitivity): <2 ng/L (ref <18)

## 2020-05-05 MED ORDER — METHOCARBAMOL 500 MG PO TABS: 500.0000 mg | ORAL_TABLET | Freq: Two times a day (BID) | ORAL | 0 refills | Status: DC

## 2020-05-05 MED ORDER — SUCRALFATE 1 GM/10ML PO SUSP: 1.0000 g | Freq: Three times a day (TID) | ORAL | 0 refills | Status: DC

## 2020-05-05 NOTE — Discharge Instructions (Signed)
I have referred  you to Neurology and Cardiology.  I have written you for a muscle relaxer and the Carafate  Return for new or worsening symptoms

## 2020-05-05 NOTE — ED Notes (Signed)
Patient refused Vital signs.

## 2020-05-05 NOTE — ED Provider Notes (Signed)
[facility] EMERGENCY DEPARTMENT Provider Note   CSN: [id number] Arrival date & time: 05/04/20  2236     History Chief Complaint  Patient presents with  . Chest Pain    Vanessa Porter is a 54 y.o. female with past medical history significant for fibromyalgia, hypothyroidism, lymphedema, arthritis chronic neck pain, recurrent concussion, asthma, recurrent headache who presents for evaluation of multiple complaints.  #1 chest pain.  Patient states while she was eating dinner yesterday evening she felt an aching sensation center of her chest to radiate up into her throat.  Pain lasted approximately 5 minutes and resolved.  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  She had this similar complaint many years ago and was seen by cardiology and had a stress test.  Per patient this was "negative."  She has never had a heart catheterization.  Chest pain is nonexertional nonpleuritic in nature.  She did have some mild nausea however no diaphoresis, lightheadedness or dizziness.  Pain did not extend into her arms or back.  She does not any chest pain when she does ADLs at home.  She has no current pain.  She is not currently followed by cardiology.  Her mother does see Dr. Yancey Flemings she will female cardiology and she would like referral to them.  #2 headache.  Patient states she has been having recurrent headaches.  Has diagnosed history of postconcussion syndrome.  Was seen here in the emergency department 1 month ago after hitting her head on the roof of a car while getting her car service.  Had a CT scan which showed concern for possible subarachnoid hemorrhage.  Previous provider discussed with neurosurgery.  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  They did recommend MR brain.  Patient had this performed here in the emergency department which showed nonspecific FLAIR consistent with either demyelinating disease versus chronic  ischemia.  Per patient the ED provider that saw her discussed with neurology and they thought this is likely my screw vascular changes.  They recommend she follow-up with her PCP.  She did follow-up with her PCP for this and they have referred her outpatient to neurology.  Patient has an appointment in 1 month at [facility] neurology.  Patient states she also has chronic neck pain.  She was being followed by physical therapy for this however not recently.  No additional injury or trauma.  Had CT cervical spine 1 month ago which did not show any significant abnormalities.  She is followed by Rosanne Gutting would like to know if she can get a referral for physical therapy.  Patient's headache and neck pain has not changed since onset.  She denies any sudden onset thunderclap headache, blurred vision, diplopia.  She did recently see Dr. Idolina Primer with ophthalmology and had her prescription changed.  She has not filled this yet.  States she has had chronic, intermittent paresthesias all over her body for many years.  Patient states that the last approximately 5 to 10 seconds and self resolved.  Described as tingling.  No new weakness, paresthesias  Denies fever, chills, nausea, vomiting, shortness of breath, hemoptysis abdominal pain, diarrhea, dysuria, bowel or bladder incontinence, saddle paresthesia.  No changes in gait however does states she has a "loose pelvis so I do not walk right."  History obtained from patient and past medical records. No interpretor was used.  Patient states she would also like to be tested for Covid as her daughter whom  she lives with in [place] is positive for Covid.  She has been staying here in [place] with family due to Covid positive family member.  Patient states she plans on being in [place] for at least the next month due to family having to isolate.  HPI     Past Medical History:  Diagnosis Date  . Arthritis   . Breast cancer ([place])   . Fibromyalgia   . Hypermobile joints     . Hypothyroid   . Lymphedema     There are no problems to display for this patient.   Past Surgical History:  Procedure Laterality Date  . CYSTOCELE REPAIR    . KNEE SURGERY    . RECTOCELE REPAIR    . TONSILLECTOMY    . vaginal sling       OB History    Gravida  3   Para  3   Term      Preterm      AB      Living  3     SAB      TAB      Ectopic      Multiple      Live Births              Family History  Problem Relation Age of Onset  . Hypertension Mother   . CAD Father     Social History   Tobacco Use  . Smoking status: Never Smoker  . Smokeless tobacco: Never Used  Substance Use Topics  . Alcohol use: No  . Drug use: No    Home Medications Prior to Admission medications   Medication Sig Start Date End Date Taking? Authorizing Provider  albuterol (PROVENTIL HFA;VENTOLIN HFA) 108 (90 BASE) MCG/ACT inhaler Inhale 2 puffs into the lungs every 6 (six) hours as needed for wheezing or shortness of breath.     [provider]  butalbital-acetaminophen-caffeine (FIORICET) 50-325-40 MG tablet Take by mouth 2 (two) times daily as needed for headache.    [provider]  cetirizine (ZYRTEC) 10 MG tablet Take 10 mg by mouth daily.    [provider]  Cholecalciferol (VITAMIN D3) 1000 UNITS CAPS Take 1,000 Units by mouth daily.     [provider]  Cyanocobalamin (VITAMIN B 12 PO) by Other route. Injection monthly    [provider]  Ergocalciferol (VITAMIN D2 PO) Take 100 mg by mouth.    [provider]  fluticasone (FLOVENT HFA) 44 MCG/ACT inhaler Inhale 1 puff into the lungs 2 (two) times daily as needed (for shortness of breath).    [provider]  ibuprofen (ADVIL,MOTRIN) 600 MG tablet Take 600 mg by mouth every 6 (six) hours as needed for mild pain.    [provider]  levothyroxine (SYNTHROID, LEVOTHROID) 88 MCG tablet Take 100 mcg by mouth daily before breakfast.      [provider]  magnesium citrate solution Take 250 mLs by mouth once.    [provider]  methocarbamol (ROBAXIN) 500 MG tablet Take 1 tablet (500 mg total) by mouth 2 (two) times daily. 05/05/20   Lylith Bebeau A, PA-C  Multiple Vitamins-Minerals (MULTIVITAMIN & MINERAL PO) Take 1 tablet by mouth daily.    [provider]  ondansetron (ZOFRAN ODT) 4 MG disintegrating tablet Take 1 tablet (4 mg total) by mouth every 8 (eight) hours as needed. 04/07/20   McDonald, Mia A, PA-C  polyethylene glycol powder (GLYCOLAX/MIRALAX) powder Take 17 g by mouth  2 (two) times daily.    [provider]  sucralfate (CARAFATE) 1 GM/10ML suspension Take 10 mLs (1 g total) by mouth 4 (four) times daily -  with meals and at bedtime. 05/05/20   Clelia Trabucco A, PA-C  Levonorgestrel (MIRENA IU) 1 each by Intrauterine route once. 02/14/13 03/04/19  [provider]  metoCLOPramide (REGLAN) 10 MG tablet Take 1 tablet (10 mg total) by mouth every 6 (six) hours. 08/04/14 03/04/19  Antonietta Breach, PA-C  mometasone (NASONEX) 50 MCG/ACT nasal spray Place 2 sprays into the nose 2 (two) times daily.  03/04/19  [provider]    Allergies    Aspirin, Benadryl [diphenhydramine hcl], Clindamycin/lincomycin, Compazine, Fructose, Midazolam hcl, Amino acids, Dairy aid [lactase], Doxycycline, Indomethacin, Pneumococcal vaccines, Sulfa antibiotics, Feldene [piroxicam], and Latex  Review of Systems   Review of Systems  Constitutional: Negative.   HENT: Negative.   Eyes: Negative.   Respiratory: Negative.   Cardiovascular: Positive for chest pain. Negative for palpitations and leg swelling.  Gastrointestinal: Negative.   Genitourinary: Negative.   Musculoskeletal: Positive for neck pain (Chronic ). Negative for arthralgias, back pain, gait problem, joint swelling, myalgias and neck stiffness.  Skin: Negative.   Neurological: Positive for headaches (Chronic, recurrent). Negative for  dizziness, tremors, seizures, syncope, facial asymmetry, speech difficulty, weakness, light-headedness and numbness.  All other systems reviewed and are negative.   Physical Exam Updated Vital Signs BP 115/66   Pulse 78   Temp 98.2 F (36.8 C) (Oral)   Resp 15   Ht 5\' 4"  (1.626 m)   Wt 69.4 kg   LMP 03/02/2019   SpO2 100%   BMI 26.26 kg/m   Physical Exam Physical Exam  Constitutional: Pt is oriented to person, place, and time. Pt appears well-developed and well-nourished. No distress.  HENT:  Head: Normocephalic and atraumatic.  Mouth/Throat: Oropharynx is clear and moist.  Eyes: Conjunctivae and EOM are normal. Pupils are equal, round, and reactive to light. No scleral icterus.  No horizontal, vertical or rotational nystagmus  Neck: Normal range of motion. Neck supple.  Full active and passive ROM without pain No midline or paraspinal tenderness No nuchal rigidity or meningeal signs  Cardiovascular: Normal rate, regular rhythm and intact distal pulses.  No JVD Pulmonary/Chest: Effort normal and breath sounds normal. No respiratory distress. Pt has no wheezes. No rales.  Abdominal: Soft. Bowel sounds are normal. There is no tenderness. There is no rebound and no guarding.  Musculoskeletal: Normal range of motion.  Moves all 4 extremities without difficulty.  No bony tenderness No lower extremity edema, erythema or warmth.  Compartments soft.  Bevelyn Buckles' sign negative Lymphadenopathy:    No cervical adenopathy.  Neurological: Pt. is alert and oriented to person, place, and time. He has normal reflexes. No cranial nerve deficit.  Exhibits normal muscle tone. Coordination normal.  Mental Status:  Alert, oriented, thought content appropriate. Speech fluent without evidence of aphasia. Able to follow 2 step commands without difficulty.  Cranial Nerves:  II:  Peripheral visual fields grossly normal, pupils equal, round, reactive to light III,IV, VI: ptosis not present, extra-ocular  motions intact bilaterally  V,VII: smile symmetric, facial light touch sensation equal VIII: hearing grossly normal bilaterally  IX,X: midline uvula rise  XI: bilateral shoulder shrug equal and strong XII: midline tongue extension  Motor:  5/5 in upper and lower extremities bilaterally including strong and equal grip strength and dorsiflexion/plantar flexion Sensory: Pinprick and light touch normal in all extremities.  Deep Tendon Reflexes:  2+ and symmetric  Cerebellar: normal finger-to-nose with bilateral upper extremities Gait: normal gait and balance CV: distal pulses palpable throughout   Skin: Skin is warm and dry. No rash noted. Pt is not diaphoretic.  Psychiatric: Pt has a normal mood and affect. Behavior is normal. Judgment and thought content normal.  Nursing note and vitals reviewed. ED Results / Procedures / Treatments   Labs (all labs ordered are listed, but only abnormal results are displayed) Labs Reviewed  BASIC METABOLIC PANEL - Abnormal; Notable for the following components:      Result Value   Glucose, Bld 102 (*)    BUN 29 (*)    All other components within normal limits  RESPIRATORY PANEL BY RT PCR (FLU A&B, COVID)  CBC  I-STAT BETA HCG BLOOD, ED ([facility], [facility], [facility] ONLY)  TROPONIN I (HIGH SENSITIVITY)  TROPONIN I (HIGH SENSITIVITY)    EKG EKG Interpretation  Date/Time:  Sunday May 04 2020 22:50:48 EDT Ventricular Rate:  64 PR Interval:  182 QRS Duration: 84 QT Interval:  382 QTC Calculation: 394 R Axis:   24 Text Interpretation: Normal sinus rhythm Normal ECG No old tracing to compare Confirmed by Daleen Bo [phone]) on 05/05/2020 9:24:21 AM   Radiology DG Chest 2 View  Result Date: 05/04/2020 CLINICAL DATA:  Headache and elevated blood pressure. EXAM: CHEST - 2 VIEW COMPARISON:  None. FINDINGS: The heart size and mediastinal contours are within normal limits. Both lungs are clear. The visualized skeletal structures are unremarkable. IMPRESSION: No  active cardiopulmonary disease. Electronically Signed   By: Virgina Norfolk M.D.   On: 05/04/2020 23:38    Procedures Procedures (including critical care time)  Medications Ordered in ED Medications - No data to display  ED Course  I have reviewed the triage vital signs and the nursing notes.  Pertinent labs & imaging results that were available during my care of the patient were reviewed by me and considered in my medical decision making (see chart for details).  Patient presents for evaluation multiple complaints.  She is afebrile, nonseptic, non-ill-appearing.  Does help multiple specialist outpatient.  #1 chest pain-nonexertional, nonpleuritic in nature.  Does not radiate to left arm, back.  Did radiate up into the center of her neck.  Lasted approximately 5 minutes.  Does not get chest pain with ADLs.  No diaphoresis, emesis.  Has stress test previously which was "negative" per patient."  Does not currently follow cardiology.  She is without tachycardia, tachypnea or hypoxia.  She has no JVD.  Her heart and lungs are clear.  Her compartments are soft.  She has normal musculoskeletal exam.  No clinical evidence of DVT on exam.  Her pain started while she was eating dinner.  Question esophageal spasms, she does have history of this.  Symptoms do not seem consistent with ACS, PE, dissection, bacterial infectious process.  #2 headache-has history of recurrent concussion.  Head and head injury 1 month ago.  Had CT scan which showed concern for possible subarachnoid however previous provider discussed with neurosurgery and they did not think this was the case.  She had follow-up MRI which did not show subarachnoid however did show abnormal layer consistent with likely chronic microvascular changes versus demyelinating disease however.  Provider discussed with neurology and they felt patient was appropriate for outpatient follow-up.  She has neurology follow-up in 1 month.  She has a nonfocal neuro  exam without deficits.  No sudden onset thunderclap headache.  No paresthesias, weakness, diplopia, visual  field cuts.  Her neck has no stiffness or neck rigidity.  Have history of chronic neck pain.  CT cervical spine from prior ED visit reviewed which does not show any significant findings.  I low suspicion for acute neurosurgical or neurologic emergency which require inpatient admission.  Her symptoms seem chronic.  She is requesting neurology follow-up here in [place] due to Covid positive Covid family member in [place] and states she will not be there for some amount of time.  Will place follow-up for neurology.  Patient reassessed. No further CP since initial episode yesterday evening. Delta trop negative. Will have her follow up.  Patient is to be discharged with recommendation to follow up with PCP in regards to today's hospital visit. Chest pain is not likely of cardiac or pulmonary etiology d/t presentation, Wells criteria low risk, VSS, no tracheal deviation, no JVD or new murmur, RRR, breath sounds equal bilaterally, EKG without acute abnormalities, negative troponin, and negative CXR. Pt has been advised to return to the ED if CP becomes exertional, associated with diaphoresis or nausea, radiates to left jaw/arm, worsens or becomes concerning in any way. Pt appears reliable for follow up and is agreeable to discharge.   The patient has been appropriately medically screened and/or stabilized in the ED. I have low suspicion for any other emergent medical condition which would require further screening, evaluation or treatment in the ED or require inpatient management.  Patient is hemodynamically stable and in no acute distress.  Patient able to ambulate in department prior to ED.  Evaluation does not show acute pathology that would require ongoing or additional emergent interventions while in the emergency department or further inpatient treatment.  I have discussed the diagnosis with the  patient and answered all questions.  Pain is been managed while in the emergency department and patient has no further complaints prior to discharge.  Patient is comfortable with plan discussed in room and is stable for discharge at this time.  I have discussed strict return precautions for returning to the emergency department.  Patient was encouraged to follow-up with PCP/specialist refer to at discharge.   Patient understands she will be called if COVID test is positive     MDM Rules/Calculators/A&P                          Vanessa Porter was evaluated in Emergency Department on 05/05/2020 for the symptoms described in the history of present illness. She was evaluated in the context of the global COVID-19 pandemic, which necessitated consideration that the patient might be at risk for infection with the SARS-CoV-2 virus that causes COVID-19. Institutional protocols and algorithms that pertain to the evaluation of patients at risk for COVID-19 are in a state of rapid change based on information released by regulatory bodies including the CDC and federal and state organizations. These policies and algorithms were followed during the patient's care in the ED.  Final Clinical Impression(s) / ED Diagnoses Final diagnoses:  Nonspecific chest pain  Contact with and (suspected) exposure to covid-19    Rx / DC Orders ED Discharge Orders         Ordered    Ambulatory referral to Cardiology        05/05/20 1145    Ambulatory referral to Neurology       Comments: An appointment is requested in approximately: 2 weeks   05/05/20 1145    methocarbamol (ROBAXIN) 500 MG tablet  2 times  daily        05/05/20 1146    sucralfate (CARAFATE) 1 GM/10ML suspension  3 times daily with meals & bedtime        05/05/20 1146           Alayha Babineaux A, PA-C 05/05/20 1148    Daleen Bo, MD 05/05/20 1648

## 2020-05-05 NOTE — ED Notes (Signed)
Pt refused b/p

## 2020-05-14 ENCOUNTER — Telehealth: Payer: Self-pay | Admitting: Neurology

## 2020-05-14 ENCOUNTER — Ambulatory Visit: Payer: Managed Care, Other (non HMO) | Admitting: Neurology

## 2020-05-14 ENCOUNTER — Encounter: Payer: Self-pay | Admitting: Neurology

## 2020-05-14 ENCOUNTER — Telehealth: Payer: Self-pay | Admitting: *Deleted

## 2020-05-14 VITALS — BP 130/64 | HR 68 | Ht 64.0 in | Wt 151.5 lb

## 2020-05-14 DIAGNOSIS — R202 Paresthesia of skin: Secondary | ICD-10-CM | POA: Diagnosis not present

## 2020-05-14 NOTE — Telephone Encounter (Signed)
This patient did not show for a new patient appointment today. 

## 2020-05-14 NOTE — Telephone Encounter (Signed)
No showed new patient appointment. 

## 2020-05-14 NOTE — Progress Notes (Signed)
Reason for visit: Abnormal MRI brain, multiple concussions, fibromyalgia, vestibular dysfunction  Referring physician: Dr. Ronda Fairly Porter is a 54 y.o. female  History of present illness:  Vanessa Porter is a 54 year old right-handed white female with a complex past medical history.  The patient indicates that she has had about 5 minor concussions over her lifetime, 1 as a child, she did have 1 as a teenager that was associated with brief loss of consciousness.  She had an episode when she fell and hit the side of her car 4 years ago and another event that occurred while swimming in January 2020.  The final event occurred when the lid at the trunk of the car fell and hit the back of her head.  This occurred in October 2020.  The patient indicates that she had some problems with clouding of consciousness, brain fog for several months after each event but always recovered.  The patient believes that she has recovered from the most recent concussion.  She also indicates that she had the diagnosis of breast cancer in May 2020, she required a lumpectomy and anesthesia resulted in some cognitive decline that was transient as well.  A similar event occurred after a hysterectomy in October 2020.  The patient has had the diagnosis of benign positional vertigo, she has chronic issues with vestibular dysfunction, she may have some nausea with bending or stooping, she has difficulty tracking moving objects.  She is in vestibular therapy actively.  The patient does get occasional headaches, the headaches are usually brought on by increased activity.  She has sleep apnea on CPAP, she sleeps 5 to 6 hours a day, she has some difficulty getting to sleep and some fatigue during the day.  The patient indicates that when she gets headaches she may have a squeezing sensation on the side of her head.  She goes on to say that she was worked up in 2004 for paresthesias of arms and legs.  She still has some tingling in  the hands.  Her left arm may feel heavy at times and occasion she may have some tingling in the legs.  She has chronic urinary urgency and incontinence following a bladder tacking procedure.  The patient claims that she was noted to have white matter changes on the brain back as far as 2004, this apparently has progressed over time.  She never had a lumbar puncture done or MRI of the cervical spine.  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  She has been followed through the [facility] clinic, they are apparently trying to get an MRA evaluation of her cerebrovascular circulation.  She comes to the office today for an evaluation.  The patient apparently also has the diagnosis of fibromyalgia.  Past Medical History:  Diagnosis Date  . Arthritis   . Breast cancer ([place])   . Fibromyalgia   . Fructose malabsorption   . Hypermobile joints   . Hypothyroid   . Lymphedema   . 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 Small intestinal bacterial overgrowth (SIBO)   . Venous malformation     Past Surgical History:  Procedure Laterality Date  . CYSTOCELE REPAIR    . KNEE SURGERY    . RECTOCELE REPAIR    . TONSILLECTOMY    . vaginal sling      Family History  Problem Relation Age of Onset  . Hypertension Mother   .  CAD Father   . Stroke Sister   . Cancer Sister     Social history:  reports that she has never smoked. She has never used smokeless tobacco. She reports that she does not drink alcohol and does not use drugs.  Medications:  Prior to Admission medications   Medication Sig Start Date End Date Taking? Authorizing Provider  albuterol (PROVENTIL HFA;VENTOLIN HFA) 108 (90 BASE) MCG/ACT inhaler Inhale 2 puffs into the lungs every 6 (six) hours as needed for wheezing or shortness of breath.    Yes [provider]  DIGESTIVE ENZYMES PO Take 2 tablets by mouth with breakfast, with  lunch, and with evening meal.   Yes [provider]  Folic Acid-Vit N9-GXQ J19 (HOMOCYSTEINE FORMULA) 0.8-50-0.1 MG TABS Take 1 capsule by mouth.   Yes [provider]  Levothyroxine Sodium (TIROSINT) 100 MCG CAPS Take by mouth daily before breakfast.   Yes [provider]  magnesium citrate solution Take 250 mLs by mouth once.   Yes [provider]  ondansetron (ZOFRAN ODT) 4 MG disintegrating tablet Take 1 tablet (4 mg total) by mouth every 8 (eight) hours as needed. 04/07/20  Yes McDonald, Mia A, PA-C  Riboflavin (B2) 100 MG TABS Take 1 tablet by mouth.   Yes [provider]  sucralfate (CARAFATE) 1 GM/10ML suspension Take 10 mLs (1 g total) by mouth 4 (four) times daily -  with meals and at bedtime. 05/05/20  Yes Henderly, Britni A, PA-C  UNABLE TO FIND GI [employer] for health   Yes [provider]  butalbital-acetaminophen-caffeine (FIORICET) 50-325-40 MG tablet Take by mouth 2 (two) times daily as needed for headache.    [provider]  cetirizine (ZYRTEC) 10 MG tablet Take 10 mg by mouth daily.    [provider]  Cholecalciferol (VITAMIN D3) 1000 UNITS CAPS Take 1,000 Units by mouth daily.     [provider]  Cyanocobalamin (VITAMIN B 12 PO) by Other route. Injection monthly    [provider]  Ergocalciferol (VITAMIN D2 PO) Take 100 mg by mouth.    [provider]  fluticasone (FLOVENT HFA) 44 MCG/ACT inhaler Inhale 1 puff into the lungs 2 (two) times daily as needed (for shortness of breath).    [provider]  ibuprofen (ADVIL,MOTRIN) 600 MG tablet Take 600 mg by mouth every 6 (six) hours as needed for mild pain.    [provider]  levothyroxine (SYNTHROID, LEVOTHROID) 88 MCG tablet Take 100 mcg by mouth daily before breakfast.     [provider]  methocarbamol (ROBAXIN) 500 MG tablet Take 1 tablet (500 mg total) by mouth 2 (two) times daily. 05/05/20    Henderly, Britni A, PA-C  Multiple Vitamins-Minerals (MULTIVITAMIN & MINERAL PO) Take 1 tablet by mouth daily.    [provider]  polyethylene glycol powder (GLYCOLAX/MIRALAX) powder Take 17 g by mouth 2 (two) times daily.    [provider]  Levonorgestrel (MIRENA IU) 1 each by Intrauterine route once. 02/14/13 03/04/19  [provider]  metoCLOPramide (REGLAN) 10 MG tablet Take 1 tablet (10 mg total) by mouth every 6 (six) hours. 08/04/14 03/04/19  Antonietta Breach, PA-C  mometasone (NASONEX) 50 MCG/ACT nasal spray Place 2 sprays into the nose 2 (two) times daily.  03/04/19  [provider]      Allergies  Allergen Reactions  . Aspirin Anaphylaxis  . Clindamycin/Lincomycin Anaphylaxis  . Compazine Anaphylaxis  . Fructose Anaphylaxis  . Midazolam Hcl  Anaphylaxis  . Amino Acids Other (See Comments)    Pt is avoiding these in her diet.   . Dairy Aid [Lactase] Other (See Comments)    Reaction:  Stomach upset   . Doxycycline Other (See Comments)    Reaction:  Vision issues   . Indomethacin Other (See Comments)    Reaction:  Headaches that caused white matter in the brain to change.   . Pneumococcal Vaccines Swelling  . Sulfa Antibiotics Hives  . Feldene [Piroxicam] Rash  . Latex Rash    ROS:  Out of a complete 14 system review of symptoms, the patient complains only of the following symptoms, and all other reviewed systems are negative.  Insomnia, fatigue Headache Dizziness, mild gait instability  Blood pressure 130/64, pulse 68, height 5\' 4"  (1.626 m), weight 151 lb 8 oz (68.7 kg), last menstrual period 03/02/2019.  Physical Exam  General: The patient is alert and cooperative at the time of the examination.  Eyes: Pupils are equal, round, and reactive to light. Discs are flat bilaterally.  Neck: The neck is supple, no carotid bruits are noted.  Respiratory: The respiratory examination is clear.  Cardiovascular: The cardiovascular examination  reveals a regular rate and rhythm, no obvious murmurs or rubs are noted.  Skin: Extremities are without significant edema.  Neurologic Exam  Mental status: The patient is alert and oriented x 3 at the time of the examination. The patient has apparent normal recent and remote memory, with an apparently normal attention span and concentration ability.  Cranial nerves: Facial symmetry is present. There is good sensation of the face to pinprick and soft touch bilaterally. The strength of the facial muscles and the muscles to head turning and shoulder shrug are normal bilaterally. Speech is well enunciated, no aphasia or dysarthria is noted. Extraocular movements are full. Visual fields are full. The tongue is midline, and the patient has symmetric elevation of the soft palate. No obvious hearing deficits are noted.  Motor: The motor testing reveals 5 over 5 strength of all 4 extremities. Good symmetric motor tone is noted throughout.  Sensory: Sensory testing is intact to pinprick, soft touch, vibration sensation, and position sense on all 4 extremities. No evidence of extinction is noted.  Coordination: Cerebellar testing reveals good finger-nose-finger and heel-to-shin bilaterally.  Gait and station: Gait is normal. Tandem gait is slightly unsteady. Romberg is negative. No drift is seen.  Reflexes: Deep tendon reflexes are symmetric and normal bilaterally. Toes are downgoing bilaterally.   Assessment/Plan:  1.  Abnormal MRI brain  2.  Postconcussive events  3.  Chronic vestibular dysfunction  4.  History of fibromyalgia  5.  Methylenetetrahydrofolate reductase deficiency  The patient does have an abnormal MRI of the brain, I do not believe that she has had adequate work-up to fully exclude demyelinating disease.  The patient will be sent for MRI of the cervical spine with and without gadolinium, if this is unremarkable, we will perform lumbar puncture.  The patient believes that she  has cognitively recovered from her last mild concussive event.  The patient will follow up here in about 6 months.   Jill Alexanders MD 05/14/2020 12:02 PM  [facility] [address] [place], [place] [address]  Phone [phone] Fax [phone]

## 2020-05-19 ENCOUNTER — Telehealth: Payer: Self-pay | Admitting: Neurology

## 2020-05-19 NOTE — Telephone Encounter (Signed)
[employer] order sent to GI. They will obtain the auth and reach out to the patient to schedule.  

## 2020-05-20 ENCOUNTER — Other Ambulatory Visit: Payer: Managed Care, Other (non HMO)

## 2020-05-20 NOTE — Telephone Encounter (Signed)
Novella Rob: [id number] (exp. 05/20/20 to 08/18/20)

## 2020-05-21 ENCOUNTER — Ambulatory Visit
Admission: RE | Admit: 2020-05-21 | Discharge: 2020-05-21 | Disposition: A | Discharge status: Home / Self Care | Payer: Managed Care, Other (non HMO) | Source: Ambulatory Visit | Attending: Neurology | Admitting: Neurology

## 2020-05-21 ENCOUNTER — Other Ambulatory Visit: Payer: Self-pay

## 2020-05-21 DIAGNOSIS — R202 Paresthesia of skin: Secondary | ICD-10-CM | POA: Diagnosis not present

## 2020-05-21 MED ORDER — GADOBENATE DIMEGLUMINE 529 MG/ML IV SOLN
14.0000 mL | Freq: Once | INTRAVENOUS | Status: AC | PRN
  Administered 2020-05-21: 14 mL via INTRAVENOUS

## 2020-05-23 ENCOUNTER — Telehealth: Payer: Self-pay | Admitting: Neurology

## 2020-05-23 NOTE — Telephone Encounter (Signed)
I called the patient.  MRI of the cervical spine is unremarkable.  We will consider a lumbar puncture.  The patient is having what sounds like migrainous headaches, she will call me when she is ready for the spinal tap.  She is having visual distortions and flashing lights associated with headache and nausea.  She has recently had MRA of the head through [facility], arterial side is unremarkable, the patient does have a venous angioma.  The patient will call me when she is ready to do the lumbar puncture.  She claims also to have a methylenetetrahydrofolate reductase deficiency   MRA head 05/19/20:  IMPRESSION:  Patent/normal intracranial arterial cerebral vasculature.   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   Nonspecific supratentorial white matter changes.     MRI cervical 05/22/20:  IMPRESSION:   Unremarkable MRI cervical spine (with and without). No spinal stenosis or foraminal narrowing.

## 2020-11-24 ENCOUNTER — Ambulatory Visit: Payer: Managed Care, Other (non HMO) | Admitting: Neurology

## 2020-12-25 ENCOUNTER — Encounter: Payer: Self-pay | Admitting: Neurology

## 2020-12-25 ENCOUNTER — Telehealth: Payer: Self-pay | Admitting: Neurology

## 2020-12-25 ENCOUNTER — Ambulatory Visit: Payer: Managed Care, Other (non HMO) | Admitting: Neurology

## 2020-12-25 NOTE — Telephone Encounter (Signed)
The patient did not show for a revisit appointment today.

## 2021-01-14 ENCOUNTER — Telehealth: Payer: Self-pay | Admitting: Neurology

## 2021-01-14 MED ORDER — PREDNISONE 10 MG PO TABS: ORAL_TABLET | ORAL | 0 refills | Status: DC

## 2021-01-14 NOTE — Telephone Encounter (Signed)
Pt. Walked into the office today needing to speak with someone regarding some new problems that has happened since hitting her head on her car 3 days ago. She is trying to avoid the ER if at all possible. Best call back is [phone]

## 2021-01-14 NOTE — Addendum Note (Signed)
Addended by: Kathrynn Ducking on: 01/14/2021 05:43 PM   Modules accepted: Orders

## 2021-01-14 NOTE — Telephone Encounter (Signed)
I called the patient.  The patient bumped her head yesterday when trying to get into the car, she did not lower her head far enough and had a minor bump getting into the car.  She is now having some headache, dizziness, and spaciness.  This likely represents trauma triggered migraine, I will give her a brief course of prednisone to see if we can alleviate the symptoms.

## 2021-01-14 NOTE — Telephone Encounter (Signed)
Patient stated she has had 5 previous concussions.  She stated that 5 days ago she hit her head on her car and then 3 days ago she said while walking with her daughter she burst into tears and is emotional and everything appears to be in a bubble, as well as that evening she experienced dizziness.  She is worried she has another concussion, the concussion clinic in [place] is closing their concussion clinic so she can't go back there.    Patient is concerned that she has a new concussion versus stress.  Have not been anywhere to be evaluated.    I advised patient go to the ED for work up if she was concerned or had any worsening symptoms as well as call her PCP for advice.    She is asking to be seen by Dr. Jannifer Franklin for evaluation.    Aware Dr. Jannifer Franklin is not in this week and I am forwarding message to him for review and advisement.

## 2021-03-30 ENCOUNTER — Encounter: Payer: Self-pay | Admitting: Neurology

## 2021-03-30 ENCOUNTER — Ambulatory Visit: Payer: Managed Care, Other (non HMO) | Admitting: Neurology

## 2021-03-30 VITALS — BP 118/74 | HR 74 | Ht 64.0 in | Wt 162.1 lb

## 2021-03-30 DIAGNOSIS — R202 Paresthesia of skin: Secondary | ICD-10-CM | POA: Diagnosis not present

## 2021-03-30 NOTE — Progress Notes (Signed)
Reason for visit: Abnormal MRI brain, postconcussive symptoms, sensory alteration in hands and feet  Vanessa Porter is an 56 y.o. female  History of present illness:  Vanessa Porter is a 55 year old right-handed white female with a history of a methylenetetrahydrofolate reductase deficiency, and an abnormal MRI brain.  The patient has a history of sleep apnea on CPAP and a possible history of fibromyalgia.  The patient has had some postconcussive symptoms, she currently is followed through [facility] for this and is undergoing vestibular therapy for concussions.  Her headaches have improved significantly.  The patient worsened with some of her symptoms after COVID infection in February 2022.  The patient began having burning sensations in the hands and feet within the last month with tingling sensations in the soles of feet bilaterally and some shocklike sensations going up and down the spine.  The patient has had no change in strength or balance.  She does have some issues with urinary incontinence at times.  She reports of some issues with fatigue.  She has been noted to have an elevation in the free T4 level, her thyroid medications have been dropped recently.  She was on a vitamin tablet for her methylenetetrahydrofolate reductase deficiency, but she claims that he had very large doses of vitamin B6 and that she had a near toxic level of B6 in her blood when this was checked.  I do not have the results of this.  She comes to this office for an evaluation.  Past Medical History:  Diagnosis Date   Arthritis    Breast cancer ([place])    Fibromyalgia    Fructose malabsorption    Hypermobile joints    Hypothyroid    Lymphedema    MTHFR (methylene THF reductase) deficiency and homocystinuria ([facility])    Small intestinal bacterial overgrowth (SIBO)    Venous malformation     Past Surgical History:  Procedure Laterality Date   CYSTOCELE REPAIR     KNEE SURGERY     RECTOCELE REPAIR      TONSILLECTOMY     vaginal sling      Family History  Problem Relation Age of Onset   Hypertension Mother    CAD Father    Stroke Sister    Cancer Sister     Social history:  reports that she has never smoked. She has never used smokeless tobacco. She reports that she does not drink alcohol and does not use drugs.    Allergies  Allergen Reactions   Aspirin Anaphylaxis   Compazine Anaphylaxis   Fructose Anaphylaxis   Midazolam Hcl Anaphylaxis   Amino Acids Other (See Comments)    Pt is avoiding these in her diet.    Dairy Aid [Lactase] Other (See Comments)    Reaction:  Stomach upset    Doxycycline Other (See Comments)    Reaction:  Vision issues    Hyaluronic Acid [Sodium Hyaluronate]     Gel- Injections    Indomethacin Other (See Comments)    Reaction:  Headaches that caused white matter in the brain to change.    Pneumococcal Vaccines Swelling   Sulfa Antibiotics Hives   Feldene [Piroxicam] Rash   Latex Rash    Medications:  Prior to Admission medications   Medication Sig Start Date End Date Taking? Authorizing Provider  albuterol (PROVENTIL HFA;VENTOLIN HFA) 108 (90 BASE) MCG/ACT inhaler Inhale 2 puffs into the lungs every 6 (six) hours as needed for wheezing or shortness of breath.  Yes [provider]  Cyanocobalamin (VITAMIN B 12 PO) by Other route. Injection monthly   Yes [provider]  DIGESTIVE ENZYMES PO Take 2 tablets by mouth with breakfast, with lunch, and with evening meal.   Yes [provider]  ibuprofen (ADVIL,MOTRIN) 600 MG tablet Take 600 mg by mouth every 6 (six) hours as needed for mild pain.   Yes [provider]  Levothyroxine Sodium (TIROSINT) 88 MCG CAPS Take by mouth daily before breakfast.   Yes [provider]  Multiple Vitamins-Minerals (MULTIVITAMIN & MINERAL PO) Take 1 tablet by mouth daily.   Yes [provider]  ondansetron (ZOFRAN ODT) 4 MG disintegrating tablet Take 1 tablet (4 mg  total) by mouth every 8 (eight) hours as needed. 04/07/20  Yes McDonald, Mia A, PA-C  polyethylene glycol powder (GLYCOLAX/MIRALAX) powder Take 17 g by mouth 2 (two) times daily.   Yes [provider]  sucralfate (CARAFATE) 1 GM/10ML suspension Take 10 mLs (1 g total) by mouth 4 (four) times daily -  with meals and at bedtime. 05/05/20  Yes Henderly, Britni A, PA-C  UNABLE TO FIND GI [employer] for health   Yes [provider]  UNABLE TO FIND Med Name: Supplements Urinary tract, bone guard, probiotic, digestive enzymes,   Yes [provider]  VITAMIN D PO Take 5,000 Units by mouth.   Yes [provider]  butalbital-acetaminophen-caffeine (FIORICET) 50-325-40 MG tablet Take by mouth 2 (two) times daily as needed for headache. Patient not taking: Reported on 03/30/2021    [provider]  cetirizine (ZYRTEC) 10 MG tablet Take 10 mg by mouth daily. Patient not taking: Reported on 03/30/2021    [provider]  Folic Acid-Vit [id number] (HOMOCYSTEINE FORMULA) 0.8-50-0.1 MG TABS Take 1 capsule by mouth. Patient not taking: Reported on 03/30/2021    [provider]  magnesium citrate solution Take 250 mLs by mouth once. Patient not taking: Reported on 03/30/2021    [provider]  Riboflavin (B2) 100 MG TABS Take 1 tablet by mouth. Patient not taking: Reported on 03/30/2021    [provider]  Levonorgestrel (MIRENA IU) 1 each by Intrauterine route once. 02/14/13 03/04/19  [provider]  metoCLOPramide (REGLAN) 10 MG tablet Take 1 tablet (10 mg total) by mouth every 6 (six) hours. 08/04/14 03/04/19  Antonietta Breach, PA-C  mometasone (NASONEX) 50 MCG/ACT nasal spray Place 2 sprays into the nose 2 (two) times daily.  03/04/19  [provider]    ROS:  Out of a complete 14 system review of symptoms, the patient complains only of the following symptoms, and all other reviewed systems are negative.  Sensory  alteration hands and feet Fatigue Headache  Blood pressure 118/74, pulse 74, height '5\' 4"'$  (1.626 m), weight 162 lb 2 oz (73.5 kg), last menstrual period 03/02/2019.  Physical Exam  General: The patient is alert and cooperative at the time of the examination.  The patient is minimally obese.  Skin: No significant peripheral edema is noted.   Neurologic Exam  Mental status: The patient is alert and oriented x 3 at the time of the examination. The patient has apparent normal recent and remote memory, with an apparently normal attention span and concentration ability.   Cranial nerves: Facial symmetry is present. Speech is normal, no aphasia or dysarthria is noted. Extraocular movements are full. Visual fields are full.  Pupils are equal, round, and reactive to light.  Discs are flat bilaterally.  Motor: The patient has good strength in all 4 extremities.  Sensory examination: Soft touch sensation is symmetric on the face, arms, and legs.  Position sensation and pinprick sensation are symmetric, no stocking pattern pinprick sensory deficit is noted.  Coordination: The patient has good finger-nose-finger and heel-to-shin bilaterally.  Gait and station: The patient has a normal gait. Tandem gait is normal. Romberg is negative, but is unsteady.  No drift is seen.  Reflexes: Deep tendon reflexes are symmetric, normal throughout.   Assessment/Plan:  1.  History of methylenetetrahydrofolate reductase deficiency  2.  Abnormal MRI brain  3.  New onset sensory alteration, all 4 extremities  The patient will be sent for blood work today.  She has come off of the vitamin tablet that apparently had a high dose of vitamin B6, we will see if this improves her symptoms over time.  The patient be sent for lumbar puncture to look for any evidence of demyelinating disease.  Prior MRI of the cervical spine did not show spinal cord lesions.  The patient will follow up here in 4 months, in the future  she can be followed through Dr. Krista Blue.  Jill Alexanders MD 03/30/2021 2:51 PM  [facility] [address] [place], [place] [address]  Phone [phone] Fax [phone]

## 2021-04-02 ENCOUNTER — Telehealth: Payer: Self-pay

## 2021-04-02 LAB — MULTIPLE MYELOMA PANEL, SERUM
Albumin SerPl Elph-Mcnc: 4.2 g/dL (ref 2.9–4.4)
Albumin/Glob SerPl: 1.4 (ref 0.7–1.7)
Alpha 1: 0.3 g/dL (ref 0.0–0.4)
Alpha2 Glob SerPl Elph-Mcnc: 0.8 g/dL (ref 0.4–1.0)
B-Globulin SerPl Elph-Mcnc: 1.1 g/dL (ref 0.7–1.3)
Gamma Glob SerPl Elph-Mcnc: 1 g/dL (ref 0.4–1.8)
Globulin, Total: 3.1 g/dL (ref 2.2–3.9)
IgA/Immunoglobulin A, Serum: 101 mg/dL (ref 87–352)
IgG (Immunoglobin G), Serum: 858 mg/dL (ref 586–1602)
IgM (Immunoglobulin M), Srm: 130 mg/dL (ref 26–217)
Total Protein: 7.3 g/dL (ref 6.0–8.5)

## 2021-04-02 LAB — ENA+DNA/DS+SJORGEN'S
ENA RNP Ab: <0.2 AI (ref 0.0–0.9)
ENA SM Ab Ser-aCnc: <0.2 AI (ref 0.0–0.9)
ENA SSA (RO) Ab: 0.2 AI (ref 0.0–0.9)
ENA SSB (LA) Ab: 2.5 AI — ABNORMAL HIGH (ref 0.0–0.9)
dsDNA Ab: 1 IU/mL (ref 0–9)

## 2021-04-02 LAB — ANA W/REFLEX: Anti Nuclear Antibody (ANA): POSITIVE — AB

## 2021-04-02 LAB — COPPER, SERUM: Copper: 101 ug/dL (ref 80–158)

## 2021-04-02 LAB — ANGIOTENSIN CONVERTING ENZYME: Angio Convert Enzyme: 36 U/L (ref 14–82)

## 2021-04-02 LAB — LYME DISEASE SEROLOGY W/REFLEX: Lyme Total Antibody EIA: NEGATIVE

## 2021-04-02 NOTE — Telephone Encounter (Signed)
I called the pt and we reviewed results. She verbalized understanding, but would like for Dr. Jannifer Franklin to call about the [facility] level that was to be drawn? Pt advised I would send message to Dr. Jannifer Franklin on this since I wasn't sure if this was included in the blood test ordered this week.  Pt sts she would be available for lab re draw to morrow and could come if need be.

## 2021-04-02 NOTE — Telephone Encounter (Signed)
-----   Message from Kathrynn Ducking, MD sent at 04/02/2021  4:40 PM EDT ----- Blood work is unremarkable exception of positive ANA.  The antibody panel shows a single antibody elevation with SSB antibody.  In isolation, this may not be clinically significant but will need to be followed. Please call the patient. ----- Message ----- From: Lavone Neri Lab Results In Sent: 03/31/2021   7:37 AM EDT To: Kathrynn Ducking, MD

## 2021-04-03 NOTE — Telephone Encounter (Signed)
I called the patient regarding the MTHFR testing.  She indicates that she has had testing previously, she was going to try to locate the report of this and give me a copy so that I could review the study.  I indicated that the test is available through [facility] and it is not a send out study.  I did not order the test on the recent blood work done but I can order the test in the future if she cannot locate the report of the prior study done.

## 2021-04-07 NOTE — Telephone Encounter (Signed)
Pt called says she has some questions about her labs and the genetic testing. Pt requesting a call back.

## 2021-04-07 NOTE — Telephone Encounter (Signed)
I called the patient.  She has a report of the methylenetetrahydrofolate reductase mutation.  She will bring by the office so I can review this.

## 2021-04-13 ENCOUNTER — Telehealth: Payer: Self-pay | Admitting: Neurology

## 2021-04-13 NOTE — Telephone Encounter (Signed)
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  We may consider checking homocystine levels.  Prior homocystine level in 2020 was 10, which is normal.  It may be reasonable for this patient to be on vitamin replacement therapy.  The patient does have an abnormal MRI of the brain.  Neuropsychological testing has been done previously showing some reduction in overall processing speed and some decline in visual memory and reduction and executive abilities.  Some worsening of cognitive deficits have been noted since January 2021.  The patient be going for lumbar puncture to evaluate her for demyelinating disease.  We will follow the homocystine levels over time.

## 2021-04-14 ENCOUNTER — Other Ambulatory Visit: Payer: Self-pay

## 2021-04-14 ENCOUNTER — Ambulatory Visit
Admission: RE | Admit: 2021-04-14 | Discharge: 2021-04-14 | Disposition: A | Discharge status: Home / Self Care | Payer: Managed Care, Other (non HMO) | Source: Ambulatory Visit | Attending: Neurology | Admitting: Neurology

## 2021-04-14 VITALS — BP 118/62

## 2021-04-14 DIAGNOSIS — R202 Paresthesia of skin: Secondary | ICD-10-CM

## 2021-04-14 NOTE — Progress Notes (Signed)
Pt has an allergy listed as 'Other: Numbing agents - reaction: Anaphylaxis'. The pt was questioned about this and can not recall which numbing agent she had the reaction to. I notified Dr. Nelia Shi of this allergy and he ordered to use Tetracaine for the patients LP. Carrolyn Leigh, RT notified. Dr. Nelia Shi speaking to the patient prior to LP.

## 2021-04-14 NOTE — Discharge Instructions (Signed)

## 2021-04-14 NOTE — Progress Notes (Signed)
1 vial of blood drawn from pts LAC to be sent off with LP lab work. 1 successful attempt, pt tolerated well. Gauze and tape applied after. 

## 2021-04-17 ENCOUNTER — Telehealth: Payer: Self-pay | Admitting: Neurology

## 2021-04-17 DIAGNOSIS — G971 Other reaction to spinal and lumbar puncture: Secondary | ICD-10-CM

## 2021-04-17 NOTE — Telephone Encounter (Signed)
Patient called after-hours call center regarding her symptoms after her spinal tap.  I was able to talk to the patient.  She reported that she had a lumbar puncture 3 days ago.  She was compliant with the recommendation for bedrest for 24 hours.  She did fairly well the first day but on the second day she started having low back pain and headache especially when she stood upright.  She called the radiology office for advice and was told to continue to hydrate well and continue with rest.  She was told that it could take 48 hours for her to feel better.  She reported to me that she had a headache whenever she stood up and was active.  She tried to go swimming at the aquatic center but felt worse with neck pain at the time.  She reports that she has chronic neck pain and she is currently in physical therapy for musculoskeletal pain.  She was advised not to have any neck manipulation done which was scheduled for 04/20/2021.  She has a routine physical therapy appointment on 04/22/2021.  She was advised to continue to hydrate and limit her physical activity and try to rest.  She was advised that a little bit of caffeine can help with post LP headache.  Ultimately, if she has exacerbation of her pain she may need a blood patch.  I explained this to her.  She was advised to monitor her symptoms.  At this point, she did not feel severe enough to proceed to the emergency room.  She was agreeable with monitoring her symptoms and as long as she improves she does not need to check in with [place] next week but she is welcome to call us with any additional symptoms she may have.  She demonstrated understanding and agreement.

## 2021-04-18 LAB — CSF CELL COUNT WITH DIFFERENTIAL
RBC Count, CSF: 1 cells/uL — ABNORMAL HIGH
WBC, CSF: 1 cells/uL (ref 0–5)

## 2021-04-18 LAB — OLIGOCLONAL BANDS, CSF + SERM

## 2021-04-18 LAB — PROTEIN, CSF: Total Protein, CSF: 35 mg/dL (ref 15–45)

## 2021-04-18 LAB — GLUCOSE, CSF: Glucose, CSF: 59 mg/dL (ref 40–80)

## 2021-04-19 NOTE — Telephone Encounter (Signed)
I called the patient.  The spinal fluid results were normal, this would therefore go against the diagnosis of demyelinating disease such as multiple sclerosis.  The patient is to contact her office tomorrow morning if she continues to have spinal headaches, I will get her set up for a blood patch.

## 2021-04-20 ENCOUNTER — Other Ambulatory Visit: Payer: Self-pay | Admitting: Neurology

## 2021-04-20 DIAGNOSIS — G971 Other reaction to spinal and lumbar puncture: Secondary | ICD-10-CM

## 2021-04-20 NOTE — Telephone Encounter (Signed)
Pt has been scheduled for a blood patch at 7:45 in the morning.  Pt has been told they want to use MRI contrast.  Pt wants to know if she needs to have her thyroid hormone levels checked this afternoon.  Please call.

## 2021-04-20 NOTE — Telephone Encounter (Signed)
I called the patient, she is having positional headaches, coming up the neck and in the back of the head, these are fully consistent with spinal headaches, she apparently was told otherwise.  The blood patch should be done soon.

## 2021-04-20 NOTE — Telephone Encounter (Signed)
I called the patient, left a message, she is still having headaches, I will go ahead and put an order in for a blood patch to be done.

## 2021-04-20 NOTE — Telephone Encounter (Signed)
Pt has called back and was made aware of Dr Tobey Grim last message re: an order being placed for the blood patch.  Pt stated she still has questions specifically for Dr Jannifer Franklin that she needs to speak with him about. Please call

## 2021-04-20 NOTE — Addendum Note (Signed)
Addended by: Kathrynn Ducking on: 04/20/2021 08:32 AM   Modules accepted: Orders

## 2021-04-20 NOTE — Telephone Encounter (Signed)
Pt called, still having headaches, want to discuss my symptoms before setting up blood patch. Would like a call back

## 2021-04-20 NOTE — Telephone Encounter (Signed)
Noted, faxed the order to [facility] [place] fax # [phone] Ph # [phone]. She will reach out to the patient to schedule.

## 2021-04-20 NOTE — Telephone Encounter (Signed)
I called the patient.  The patient will be having a blood patch using MRI contrast, should have no effect on the thyroid.

## 2021-04-21 ENCOUNTER — Ambulatory Visit
Admission: RE | Admit: 2021-04-21 | Discharge: 2021-04-21 | Disposition: A | Discharge status: Home / Self Care | Payer: Managed Care, Other (non HMO) | Source: Ambulatory Visit | Attending: Neurology | Admitting: Neurology

## 2021-04-21 ENCOUNTER — Other Ambulatory Visit: Payer: Managed Care, Other (non HMO)

## 2021-04-21 ENCOUNTER — Telehealth: Payer: Self-pay | Admitting: Neurology

## 2021-04-21 ENCOUNTER — Other Ambulatory Visit: Payer: Self-pay

## 2021-04-21 DIAGNOSIS — G971 Other reaction to spinal and lumbar puncture: Secondary | ICD-10-CM

## 2021-04-21 MED ORDER — GADOBUTROL 1 MMOL/ML IV SOLN
1.0000 mL | Freq: Once | INTRAVENOUS | Status: AC | PRN
  Administered 2021-04-21: 1 mL

## 2021-04-21 NOTE — Telephone Encounter (Signed)
I returned phone call from the patient to [facility] answering service.  Patient had a spinal tap and spinal headache and underwent epidural blood patch this morning.  She had transient left leg paresthesias which has improved.  This evening she is complaining of pounding headache when lying flat.  This seems to not be compatible with spinal headache.  Recommend she takes 2 tablets of extra strength Tylenol and see if that helps.  If the headache gets worse she was asked to go to the emergency room and if it persists she was advised to call the office and if her left leg paresthesias also not resolved by tomorrow she was asked to call the office.  She voiced understanding.

## 2021-04-21 NOTE — Discharge Instructions (Signed)

## 2021-04-21 NOTE — Telephone Encounter (Signed)
The patient called our office.  She had the blood patch done this morning, immediately after procedure had onset of some sensory changes in the saddle distribution and down the leg.  No weakness or bowel bladder control problems have been noted.  She did talk to the radiologist about this, they indicated that she could go home.  The patient is at home now.  I have indicated that she is to self monitor, if the numbness progresses to include bowel or bladder dysfunction or weakness of the legs, she is to go to the emergency room immediately.  She indicated they injected 30 cc of blood, there may be some stretching of the sacral nerve roots because of this.  I suspect that the sensory alterations will improve over the next several days.

## 2021-04-21 NOTE — Progress Notes (Signed)
20cc of blood drawn from pts LAC for blood patch. 1 successful attempt, pt tolerated well. Gauze and tape applied after.

## 2021-04-22 NOTE — Telephone Encounter (Signed)
Pt called in tears due to the pounding headache she is having at this moment. Pt states she is feeling light headed, shaky and nauseated. Pt is wanting to be seen or advised on how she can control this pain.

## 2021-04-22 NOTE — Telephone Encounter (Addendum)
April 21, 2021 Garvin Fila, MD 10:32 pm   Note I returned phone call from the patient to [facility] answering service.  Patient had a spinal tap and spinal headache and underwent epidural blood patch this morning.  She had transient left leg paresthesias which has improved.  This evening she is complaining of pounding headache when lying flat.  This seems to not be compatible with spinal headache.  Recommend she takes 2 tablets of extra strength Tylenol and see if that helps.  If the headache gets worse she was asked to go to the emergency room and if it persists she was advised to call the office and if her left leg paresthesias also not resolved by tomorrow she was asked to call the office.  She voiced understanding.      I called the pt back and left a vm for her. I advised at this point it would be safest for her to report to the ER given her symptoms and the advise provided by the on-call physician last night and Dr. Jannifer Franklin yesterday during normal business hours.   Please advised pt of this once she calls back.

## 2021-04-22 NOTE — Telephone Encounter (Signed)
I called the pt again since I had not heard back on the vm I left.  Pt reports she has been feeling better since she originally called. Headache has improved some but she has been taking easy and still does not feel like her self. She denies increased weakness or bowel/bladder control problems. She sts she has spoke with the Radiologist who completed her procedure and was advised to monitor her symptoms and if headache greatly worsened or leg weakness/ bowel and bladder dysfunction became an issue she should report to the ED.  Pt reports she does not feel like she needs to report to the ER right now and will continue to self monitor. She did want to ask Dr. Jannifer Franklin if it would be safe for her to have a massage tomorrow or if this should be avoided due to the recent blood patch? The massage is a focused massage on neck stiffness.   She sts she spoke with the Radiologist about this earlier and was told it should be fine but to check with the Neurologist as well. I advised I would send message to Dr. Jannifer Franklin to see if he could review/answer if not I would check with the work in doctor tomorrow morning.    Pt was agreeable to this plan.

## 2021-04-23 ENCOUNTER — Emergency Department (HOSPITAL_COMMUNITY)
Admission: EM | Admit: 2021-04-23 | Discharge: 2021-04-24 | Disposition: A | Discharge status: Home / Self Care | Payer: Managed Care, Other (non HMO) | Attending: Emergency Medicine | Admitting: Emergency Medicine

## 2021-04-23 ENCOUNTER — Encounter (HOSPITAL_COMMUNITY): Payer: Self-pay | Admitting: Emergency Medicine

## 2021-04-23 ENCOUNTER — Other Ambulatory Visit: Payer: Self-pay

## 2021-04-23 DIAGNOSIS — R519 Headache, unspecified: Secondary | ICD-10-CM | POA: Diagnosis not present

## 2021-04-23 DIAGNOSIS — R202 Paresthesia of skin: Secondary | ICD-10-CM | POA: Insufficient documentation

## 2021-04-23 DIAGNOSIS — M545 Low back pain, unspecified: Secondary | ICD-10-CM | POA: Insufficient documentation

## 2021-04-23 DIAGNOSIS — Z853 Personal history of malignant neoplasm of breast: Secondary | ICD-10-CM | POA: Insufficient documentation

## 2021-04-23 DIAGNOSIS — Z79899 Other long term (current) drug therapy: Secondary | ICD-10-CM | POA: Diagnosis not present

## 2021-04-23 DIAGNOSIS — Z91040 Latex allergy status: Secondary | ICD-10-CM | POA: Diagnosis not present

## 2021-04-23 DIAGNOSIS — E039 Hypothyroidism, unspecified: Secondary | ICD-10-CM | POA: Diagnosis not present

## 2021-04-23 DIAGNOSIS — M549 Dorsalgia, unspecified: Secondary | ICD-10-CM

## 2021-04-23 LAB — CBC WITH DIFFERENTIAL/PLATELET
Abs Immature Granulocytes: 0.06 10*3/uL (ref 0.00–0.07)
Basophils Absolute: 0.1 10*3/uL (ref 0.0–0.1)
Basophils Relative: 1 %
Eosinophils Absolute: 0.2 10*3/uL (ref 0.0–0.5)
Eosinophils Relative: 2 %
HCT: 43.6 % (ref 36.0–46.0)
Hemoglobin: 14.4 g/dL (ref 12.0–15.0)
Immature Granulocytes: 1 %
Lymphocytes Relative: 29 %
Lymphs Abs: 2.5 10*3/uL (ref 0.7–4.0)
MCH: 29.3 pg (ref 26.0–34.0)
MCHC: 33 g/dL (ref 30.0–36.0)
MCV: 88.8 fL (ref 80.0–100.0)
Monocytes Absolute: 0.7 10*3/uL (ref 0.1–1.0)
Monocytes Relative: 8 %
Neutro Abs: 5.3 10*3/uL (ref 1.7–7.7)
Neutrophils Relative %: 59 %
Platelets: 268 10*3/uL (ref 150–400)
RBC: 4.91 MIL/uL (ref 3.87–5.11)
RDW: 12.5 % (ref 11.5–15.5)
WBC: 8.8 10*3/uL (ref 4.0–10.5)
nRBC: 0 % (ref 0.0–0.2)

## 2021-04-23 LAB — COMPREHENSIVE METABOLIC PANEL
ALT: 33 U/L (ref 0–44)
AST: 27 U/L (ref 15–41)
Albumin: 4.3 g/dL (ref 3.5–5.0)
Alkaline Phosphatase: 73 U/L (ref 38–126)
Anion gap: 8 (ref 5–15)
BUN: 22 mg/dL — ABNORMAL HIGH (ref 6–20)
CO2: 26 mmol/L (ref 22–32)
Calcium: 9.6 mg/dL (ref 8.9–10.3)
Chloride: 103 mmol/L (ref 98–111)
Creatinine, Ser: 1 mg/dL (ref 0.44–1.00)
GFR, Estimated: >60 mL/min (ref >60)
Glucose, Bld: 103 mg/dL — ABNORMAL HIGH (ref 70–99)
Potassium: 4.4 mmol/L (ref 3.5–5.1)
Sodium: 137 mmol/L (ref 135–145)
Total Bilirubin: 0.5 mg/dL (ref 0.3–1.2)
Total Protein: 7.6 g/dL (ref 6.5–8.1)

## 2021-04-23 NOTE — ED Notes (Signed)
Pt refused vitals because we do not have a manual BP in the lobby area to check it by

## 2021-04-23 NOTE — ED Triage Notes (Signed)
Patient reports low back pain with numbness onset Tuesday after a back procedure , pain radiating down both legs , denies urinary/bowel incontinence.

## 2021-04-23 NOTE — Telephone Encounter (Signed)
I called the pt and advised per Dr. Jannifer Franklin ok to have massage as scheduled for today.  Pt verbalized understanding and appreciation for the call.  Pt reports she has been feeling better and headache has continued to improve.

## 2021-04-23 NOTE — Telephone Encounter (Signed)
Patient wants to speak to Dr. Jannifer Franklin.  Message forwarded.

## 2021-04-23 NOTE — Telephone Encounter (Signed)
I called the patient, left a message.  As we had discussed before, if the patient is mainly having sensory alterations without change in bowel or bladder function or alteration in ability to walk or any change in strength of the legs, I would not go to the emergency room.  If the patient develops weakness, gait problems, or bowel or bladder control problems, she is to go to the emergency room for evaluation.  As I had indicated previously, the sensory changes may take a week or more to resolve fully.

## 2021-04-23 NOTE — Telephone Encounter (Signed)
Pt called, leg paresthesias is not resolved, having leg pain going back of the thigh, tingling sensation. Would like a call from the nurse to discuss if I need to go to the ER.

## 2021-04-23 NOTE — ED Provider Notes (Signed)
Emergency Medicine Provider Triage Evaluation Note  Vanessa Porter , a 55 y.o. female  was evaluated in triage.  Pt complains of back pain.  Patient states that she received an epidural blood patch on Tuesday for a CSF leak following lumbar puncture.  Has been having sacral paresthesias and burning with numbness in bilateral lower extremities.  Denies changes in bowel or bladder function, fevers or chills.  Review of Systems  Positive: Lower back pain, numbness, burning Negative: Fevers, chills  Physical Exam  BP (!) 110/55 (BP Location: Left Arm)   Pulse 96   Temp 98.1 F (36.7 C)   Resp 18   Ht 5\' 3"  (1.6 m)   Wt 75 kg   LMP 03/02/2019   SpO2 99%   BMI 29.29 kg/m  Gen:   Awake, no distress   Resp:  Normal effort  MSK:   Moves extremities without difficulty  Other:  Patient is ambulatory, midline tenderness noted noted over lumbar spine no step-offs or other deformity noted.  Medical Decision Making  Medically screening exam initiated at 8:22 PM.  Appropriate orders placed.  Jonni Mcgirr was informed that the remainder of the evaluation will be completed by another provider, this initial triage assessment does not replace that evaluation, and the importance of remaining in the ED until their evaluation is complete.     Nestor Lewandowsky 04/23/21 2026    Lennice Sites, DO 04/23/21 2036

## 2021-04-24 ENCOUNTER — Encounter (HOSPITAL_COMMUNITY): Payer: Self-pay | Admitting: Emergency Medicine

## 2021-04-24 ENCOUNTER — Emergency Department (HOSPITAL_COMMUNITY): Payer: Managed Care, Other (non HMO)

## 2021-04-24 MED ORDER — FENTANYL CITRATE PF 50 MCG/ML IJ SOSY
50.0000 ug | PREFILLED_SYRINGE | INTRAMUSCULAR | Status: DC | PRN
  Administered 2021-04-24: 50 ug via INTRAVENOUS
  Filled 2021-04-24: qty 1

## 2021-04-24 MED ORDER — SODIUM CHLORIDE 0.9 % IV BOLUS
1000.0000 mL | Freq: Once | INTRAVENOUS | Status: AC
  Administered 2021-04-24: 1000 mL via INTRAVENOUS

## 2021-04-24 MED ORDER — GADOBUTROL 1 MMOL/ML IV SOLN
7.5000 mL | Freq: Once | INTRAVENOUS | Status: AC | PRN
  Administered 2021-04-24: 7.5 mL via INTRAVENOUS

## 2021-04-24 NOTE — ED Provider Notes (Signed)
[facility] EMERGENCY DEPARTMENT Provider Note   CSN: [id number] Arrival date & time: 04/23/21  1934     History Chief Complaint  Patient presents with   Low Back Pain     Vanessa Porter is a 55 y.o. female with a hx of fibromyalgia, venous malformation, and hypothyroidism who presents to the ED with complaints of back/leg pain & paresthesia.   Chart review for additional hx: Patient underwent image guided LP 04/14/21 to evaluate for MS due to abnormal MRI, subsequently had blood patch placed 04/21/21. Following LP was complaining of headache/back pain, she was advised to rest and avoid physical activity.  Her LP CSF fluid did not show significant abnormalities- specifically no oligoclonal bands. Given her persistent headaches she was scheduled for a blood patch which she received 04/21/21, had 30 cc of blood injected. Patient called the neurology office as immediately following the procedure she had some sensory changes in the saddle distribution and down the legs with headache. Had some neck manipulation done via massage to help with her headache with some improvement, mild headache @ present to the back of her head, had similar when she was a child. Complaints of continued numbness/burning to the saddle area and to the bilateral proximal legs. States she had similar reaction to spinal steroid injection several years ago. Yesterday while in the car on the way from therapy she started to have a deeper pain to the back/legs/saddle region, worse with even light touch and supine position, improved with sitting/laying on the side. She denies weakness, fever, incontinence, retention, visual disturbance, or vomiting.         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   Past Surgical History:  Procedure Laterality Date   CYSTOCELE REPAIR     KNEE SURGERY     RECTOCELE REPAIR     TONSILLECTOMY     vaginal sling       OB History     Gravida  3   Para  3   Term      Preterm      AB      Living  3      SAB      IAB      Ectopic      Multiple      Live Births              Family History  Problem Relation Age of Onset   Hypertension Mother    CAD Father    Stroke Sister    Cancer Sister     Social History   Tobacco Use   Smoking status: Never   Smokeless tobacco: Never  Substance Use Topics   Alcohol use: No   Drug use: No    Home Medications Prior to Admission medications   Medication Sig Start Date End Date Taking? Authorizing Provider  albuterol (PROVENTIL HFA;VENTOLIN HFA) 108 (90 BASE) MCG/ACT inhaler Inhale 2 puffs into the lungs every 6 (six) hours as needed for wheezing or shortness of breath.     [provider]  Cyanocobalamin (VITAMIN B 12 PO) by Other route. Injection monthly    [provider]  DIGESTIVE ENZYMES PO Take 2 tablets by mouth with breakfast, with lunch, and with evening meal.    [provider]  ibuprofen (ADVIL,MOTRIN) 600 MG tablet Take 600 mg by mouth every 6 (six) hours as needed for mild pain.    [provider]  Levothyroxine Sodium (TIROSINT) 88 MCG CAPS Take by mouth daily before breakfast.    [provider]  Multiple Vitamins-Minerals (MULTIVITAMIN & MINERAL PO) Take 1 tablet by mouth daily.    [provider]  ondansetron (ZOFRAN ODT) 4 MG disintegrating tablet Take 1 tablet (4 mg total) by mouth every 8 (eight) hours as needed. 04/07/20   McDonald, Mia A, PA-C  polyethylene glycol powder (GLYCOLAX/MIRALAX) powder Take 17 g by mouth 2 (two) times daily.    [provider]  sucralfate  (CARAFATE) 1 GM/10ML suspension Take 10 mLs (1 g total) by mouth 4 (four) times daily -  with meals and at bedtime. 05/05/20   Henderly, Britni A, PA-C  UNABLE TO FIND GI [employer] for health    [provider]  UNABLE TO FIND Med Name: Supplements Urinary tract, bone guard, probiotic, digestive enzymes,    [provider]  VITAMIN D PO Take 5,000 Units by mouth.    [provider]  Levonorgestrel (MIRENA IU) 1 each by Intrauterine route once. 02/14/13 03/04/19  [provider]  metoCLOPramide (REGLAN) 10 MG tablet Take 1 tablet (10 mg total) by mouth every 6 (six) hours. 08/04/14 03/04/19  Antonietta Breach, PA-C  mometasone (NASONEX) 50 MCG/ACT nasal spray Place 2 sprays into the nose 2 (two) times daily.  03/04/19  [provider]    Allergies    Aspirin, Compazine, Fructose, Midazolam hcl, Omnipaque [iohexol], Other, Permethrin, Sulfa antibiotics, Xylitol, Amino acids, Dairy aid [lactase], Doxycycline, Hyaluronic acid [sodium hyaluronate], Indomethacin, Nickel, Pneumococcal vaccines, Feldene [piroxicam], Latex, and Milk [lac bovis]  Review of Systems   Review of Systems  Constitutional:  Negative for chills and fever.  Gastrointestinal:  Negative for abdominal pain and vomiting.  Genitourinary:  Negative for dysuria and hematuria.  Musculoskeletal:  Positive for back pain.  Neurological:  Positive for numbness and headaches. Negative for syncope, speech difficulty and weakness.       Negative for incontinence/saddle anesthesia.   All other systems reviewed and are negative.  Physical Exam Updated Vital Signs BP (!) 110/55 (BP Location: Left Arm)   Pulse 96   Temp 98.1 F (36.7 C)   Resp 18   Ht 5\' 3"  (1.6 m)   Wt 75 kg   LMP 03/02/2019   SpO2 99%   BMI 29.29 kg/m   Physical Exam Vitals and nursing note reviewed.  Constitutional:      General: She is not in acute distress.    Appearance: She is well-developed. She is not  toxic-appearing.  HENT:     Head: Normocephalic and atraumatic.  Eyes:     General:        Right eye: No discharge.        Left eye: No discharge.     Conjunctiva/sclera: Conjunctivae normal.  Cardiovascular:     Rate and Rhythm: Normal rate and regular rhythm.     Pulses:          Dorsalis pedis pulses are 2+ on the right side and 2+ on the left side.       Posterior tibial  pulses are 2+ on the right side and 2+ on the left side.  Pulmonary:     Effort: Pulmonary effort is normal. No respiratory distress.     Breath sounds: Normal breath sounds. No wheezing, rhonchi or rales.  Abdominal:     General: There is no distension.     Palpations: Abdomen is soft.     Tenderness: There is no abdominal tenderness.  Musculoskeletal:     Cervical back: Neck supple. No rigidity.     Comments: No obvious deformities or rashes noted.  Back: Tender to midline L spine and to the sacrum. Tender to the bilateral lumbar paraspinal muscles.  Lower extremities: Patient has intact AROM throughout all major joints. Tender to the posterior thighs/gluteal region bilaterally.  No focal bony tenderness. Compartments are soft.   Skin:    General: Skin is warm and dry.     Findings: No rash.  Neurological:     Mental Status: She is alert.     Comments: Alert. Clear speech. No facial droop. CN III-XII grossly intact. Sensation grossly intact to light touch x 4. 5/5 strength with grip strength, hip flexion/extension, knee flexion/extension, and ankle plantar/dorsiflexion bilaterally. 2+ symmetric patellar DTRs.   Psychiatric:        Behavior: Behavior normal.    ED Results / Procedures / Treatments   Labs (all labs ordered are listed, but only abnormal results are displayed) Labs Reviewed  COMPREHENSIVE METABOLIC PANEL - Abnormal; Notable for the following components:      Result Value   Glucose, Bld 103 (*)    BUN 22 (*)    All other components within normal limits  CBC WITH DIFFERENTIAL/PLATELET     EKG None  Radiology MR Lumbar Spine W Wo Contrast  Result Date: 04/24/2021 CLINICAL DATA:  Low back pain, suspected cauda equina syndrome. Recent epidural blood patch for CSF leak following lumbar puncture EXAM: MRI LUMBAR SPINE WITHOUT AND WITH CONTRAST TECHNIQUE: Multiplanar and multiecho pulse sequences of the lumbar spine were obtained without and with intravenous contrast. CONTRAST:  7.63mL GADAVIST GADOBUTROL 1 MMOL/ML IV SOLN COMPARISON:  None. FINDINGS: Segmentation: Standard; the lowest formed disc space is designated L5-S1. Alignment:  Normal Vertebrae: There are intraosseous hemangiomas in the L2 and L5 vertebral bodies. Marrow signal is otherwise normal. There is no suspicious marrow signal abnormality. There is no abnormal enhancement. Conus medullaris and cauda equina: Conus extends to the mid L2 level. Conus and cauda equina appear normal. There is no abnormal enhancement. Paraspinal and other soft tissues: Mild STIR signal abnormality in the posterior soft tissues at L3-L4 is likely related to the recent procedure. The paraspinal soft tissues are otherwise unremarkable. There is no pseudomeningocele. 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 A 2.2 cm T2 hyperintense lesion in the left lower pole likely reflects a cyst. Disc levels: There is minimal disc desiccation without significant loss of height throughout the lumbar spine. There is minimal facet arthropathy in the lower lumbar spine. There is no evidence of disc herniation. There is no spinal canal or neural foraminal stenosis. There is no evidence of nerve root impingement. IMPRESSION: Essentially normal MRI of the lumbar spine. No evidence of nerve root impingement or pseudomeningocele. Electronically Signed   By: Valetta Mole M.D.   On: 04/24/2021 11:19    Procedures Procedures   Medications Ordered in ED Medications - No data to display  ED Course  I have reviewed the triage vital signs and the nursing  notes.  Pertinent labs & imaging results that were available during my care of the patient were reviewed by me and considered in my medical decision making (see chart for details).    MDM Rules/Calculators/A&P                           Patient presents to the ED with complaints of paresthesias and pain to the lower back, saddle region, and proximal legs. She is nontoxic, vitals without significant abnormality.   Additional history obtained:  Additional history obtained from chart review & nursing note review.- chart review as documented in HPI.   Lab Tests:  I have reviewed and interpreted labs, which included:  CBC/CMP: Fairly unremarkable.   I discussed with neurologist Dr. Rory Percy, in agreement with MRI L spine w/wo contrast, no further imaging recommended at this time, if negative MRI follow up with outpatient neurologist.   Imaging Studies ordered:  MRI L spine w/wo contrast I independently reviewed, formal radiology impression shows:   Essentially normal MRI of the lumbar spine. No evidence of nerve root impingement or pseudomeningocele  ED Course:  Reassuring labs & MRI. Patient remains with good strength throughout, symmetric 2+ reflexes, and is ambulatory in the ED. She overall appears appropriate for discharge with close outpatient neurology follow up. I discussed results, treatment plan, need for follow-up, and return precautions with the patient. Provided opportunity for questions, patient confirmed understanding and is in agreement with plan.   This is a shared visit with supervising physician Dr. Dina Rich who has independently evaluated patient & is in agreement.    Portions of this note were generated with [employer]. Dictation errors may occur despite best attempts at proofreading.  Final Clinical Impression(s) / ED Diagnoses Final diagnoses:  Acute bilateral back pain, unspecified back location    Rx / DC Orders ED Discharge Orders     None         Amaryllis Dyke, PA-C 04/24/21 [place], [place], DO 04/25/21 [phone]

## 2021-04-24 NOTE — ED Notes (Signed)
Patient transported to MRI 

## 2021-04-24 NOTE — ED Notes (Signed)
Patient remains in MRI 

## 2021-04-24 NOTE — ED Notes (Signed)
Pt ambulated without assistance on steady gait, reported worsening of pain with ambulation. 5 out 10

## 2021-04-24 NOTE — ED Notes (Signed)
Pt refused to have BP checked. Pt only allowed HR and SpO2 to be checked.

## 2021-04-24 NOTE — Discharge Instructions (Addendum)
You were seen in the ER today for lower back pain and leg pain with tingling/burning. Your labs and MRI were reassuring. We would like you to follow up with your neurologist, please call them today to schedule an appointment as soon as possible. Return to the ER for new or worsening symptoms including but not limited to new or worsening pain, worsening tingling, weakness, trouble walking, loss of control of your bowel or bladder function or any other concerns.

## 2021-04-27 ENCOUNTER — Telehealth: Payer: Self-pay | Admitting: Neurology

## 2021-04-27 DIAGNOSIS — M545 Low back pain, unspecified: Secondary | ICD-10-CM

## 2021-04-27 NOTE — Telephone Encounter (Signed)
Pt states since the blood patch she had on Tues she has not been well, pt has been in ED on Saturday.  Pt would like to know if Dr Jannifer Franklin would like to see her before [facility].  Pt would also like a referral for physical therapy, please call.

## 2021-04-27 NOTE — Telephone Encounter (Signed)
Spoke with PT department at [facility]. They could not locate that anyone spoke with patient to tell her she could be seen at 2pm today. They advised that patient could possibly be seen this week, just not today. Faxed orders to [phone].

## 2021-04-27 NOTE — Telephone Encounter (Signed)
I called the patient.  She is still having some discomfort in the low back and buttocks and legs.  MRI done to the emergency room of the lumbar spine was completely normal.  She may be interested in physical therapy, she has requested [facility].  The patient will be seen in the office this week.

## 2021-04-27 NOTE — Telephone Encounter (Signed)
Pt states that [facility]. Could see her this afternoon @ 2pm if the referral could be processed and sent back in time, please call.

## 2021-04-30 ENCOUNTER — Encounter: Payer: Self-pay | Admitting: Neurology

## 2021-04-30 ENCOUNTER — Ambulatory Visit (INDEPENDENT_AMBULATORY_CARE_PROVIDER_SITE_OTHER): Payer: Managed Care, Other (non HMO) | Admitting: Neurology

## 2021-04-30 VITALS — BP 108/74 | HR 82 | Ht 64.0 in | Wt 158.0 lb

## 2021-04-30 DIAGNOSIS — M5441 Lumbago with sciatica, right side: Secondary | ICD-10-CM

## 2021-04-30 DIAGNOSIS — E7212 Methylenetetrahydrofolate reductase deficiency: Secondary | ICD-10-CM

## 2021-04-30 DIAGNOSIS — M5442 Lumbago with sciatica, left side: Secondary | ICD-10-CM

## 2021-04-30 DIAGNOSIS — E7211 Homocystinuria: Secondary | ICD-10-CM | POA: Insufficient documentation

## 2021-04-30 MED ORDER — PREDNISONE 10 MG PO TABS: ORAL_TABLET | ORAL | 0 refills | Status: DC

## 2021-04-30 NOTE — Progress Notes (Signed)
Reason for visit: Fibromyalgia, methylenetetrahydrofolate reductase deficiency  Vanessa Porter is an 55 y.o. female  History of present illness:  Vanessa Porter is a 55 year old right-handed white female with a history of fibromyalgia, methylenetetrahydrofolate reductase deficiency, sleep apnea on CPAP, and abnormal findings on MRI brain.  The patient underwent a recent lumbar puncture to evaluate her for possible multiple sclerosis.  Oligoclonal banding was negative.  The patient had a spinal headache following the lumbar puncture and a blood patch was ordered.  Following the blood patch, the patient immediately began having saddle distribution pain and sensory alterations without any change in bowel or bladder control.  The patient has developed tingling sensations and burning sensations down the legs on both sides with crawling sensations below the knees bilaterally.  The patient again has not noted any change in balance.  She has now had symptoms for 10 days with only minimal improvement of discomfort.  Changing positions such as standing may bring on severe pain in the saddle distribution.  She has had an emergency room visit, MRI of the lumbar spine was done and was completely unremarkable.  She returns today for an evaluation.  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 She has never used  smokeless tobacco. She reports that she does not drink alcohol and does not use drugs.    Allergies  Allergen Reactions   Aspirin Anaphylaxis   Compazine Anaphylaxis   Fructose Anaphylaxis   Midazolam Hcl Anaphylaxis   Omnipaque [Iohexol] Anaphylaxis    Per pt required epi pen   Other Anaphylaxis    Numbing medications. Does not remember name of. Pt tolerated Tetracaine 04/14/21 for LP.   Permethrin Shortness Of Breath   Sulfa Antibiotics Hives    Other reaction(s): Unknown   Xylitol Swelling    Swelling of arms, face    Amino Acids Other (See Comments)    Pt is avoiding these in her diet.    Dairy Aid [Lactase] Other (See Comments)    Reaction:  Stomach upset    Doxycycline Other (See Comments)    Reaction:  Vision issues    Hyaluronic Acid [Sodium Hyaluronate]     Gel- Injections    Indomethacin Other (See Comments)    Reaction:  Headaches that caused white matter in the brain to change.    Nickel     Other reaction(s): Other (See Comments) Green wrist after wearing a bracelet, pussy ears with gold and rash. Inflammation around gold tooth around her gum line.   Pneumococcal Vaccines Swelling   Feldene [Piroxicam] Rash   Latex Rash   Milk [Lac Bovis] Rash    Medications:  Prior to Admission medications   Medication Sig Start  Date End Date Taking? Authorizing Provider  albuterol (PROVENTIL HFA;VENTOLIN HFA) 108 (90 BASE) MCG/ACT inhaler Inhale 2 puffs into the lungs every 6 (six) hours as needed for wheezing or shortness of breath.     [provider]  Cyanocobalamin (VITAMIN B 12 PO) by Other route. Injection monthly    [provider]  DIGESTIVE ENZYMES PO Take 2 tablets by mouth with breakfast, with lunch, and with evening meal.    [provider]  ibuprofen (ADVIL,MOTRIN) 600 MG tablet Take 600 mg by mouth every 6 (six) hours as needed for mild pain.    [provider]  Levothyroxine Sodium (TIROSINT) 88 MCG CAPS Take by mouth daily  before breakfast.    [provider]  Multiple Vitamins-Minerals (MULTIVITAMIN & MINERAL PO) Take 1 tablet by mouth daily.    [provider]  ondansetron (ZOFRAN ODT) 4 MG disintegrating tablet Take 1 tablet (4 mg total) by mouth every 8 (eight) hours as needed. 04/07/20   McDonald, Mia A, PA-C  polyethylene glycol powder (GLYCOLAX/MIRALAX) powder Take 17 g by mouth 2 (two) times daily.    [provider]  sucralfate (CARAFATE) 1 GM/10ML suspension Take 10 mLs (1 g total) by mouth 4 (four) times daily -  with meals and at bedtime. 05/05/20   Henderly, Britni A, PA-C  UNABLE TO FIND [employer] for health    [provider]  UNABLE TO FIND Med Name: Supplements Urinary tract, bone guard, probiotic, digestive enzymes,    [provider]  VITAMIN D PO Take 5,000 Units by mouth.    [provider]  Levonorgestrel (MIRENA IU) 1 each by Intrauterine route once. 02/14/13 03/04/19  [provider]  metoCLOPramide (REGLAN) 10 MG tablet Take 1 tablet (10 mg total) by mouth every 6 (six) hours. 08/04/14 03/04/19  Antonietta Breach, PA-C  mometasone (NASONEX) 50 MCG/ACT nasal spray Place 2 sprays into the nose 2 (two) times daily.  03/04/19  [provider]    ROS:  Out of a complete 14 system review of symptoms, the patient complains only of the following symptoms, and all other reviewed systems are negative.  Back pain, leg discomfort   Blood pressure 108/74, pulse 82, height 5\' 4"  (1.626 m), weight 158 lb (71.7 kg), last menstrual period 03/02/2019, SpO2 98 %.  Physical Exam  General: The patient is alert and cooperative at the time of the examination.  Neuromuscular: The patient is able to flex to about 100 degrees with the low back.  Skin: No significant peripheral edema is noted.   Neurologic Exam  Mental status: The patient is alert and oriented x 3 at the time of the examination. The patient has apparent normal recent and  remote memory, with an apparently normal attention span and concentration ability.   Cranial nerves: Facial symmetry is present. Speech is normal, no aphasia or dysarthria is noted. Extraocular movements are full. Visual fields are full.  Motor: The patient has good strength in all 4 extremities.  Sensory examination: Soft touch sensation is symmetric on the face, arms, and legs.  Coordination: The patient has good finger-nose-finger and heel-to-shin bilaterally.  Gait and station: The patient has a normal gait. Tandem gait is normal. Romberg is negative. No drift is seen.  The patient is able to walk on heels and the toes bilaterally.  Reflexes: Deep tendon reflexes are symmetric, and are normal throughout.   MRI lumbar 04/24/21:  IMPRESSION: Essentially normal MRI of the lumbar spine.  No evidence of nerve root impingement or pseudomeningocele.  * MRI of his scan images were reviewed online. I agree with the written report.    Assessment/Plan:  1.  Fibromyalgia  2.  Back pain, leg discomfort following blood patch  3.  Methylenetetrahydrofolate reductase deficiency  The patient will undergo a blood test for homocystine level.  She will be placed on a prednisone Dosepak, she will be set up for physical therapy for her back.  She will follow-up for her next scheduled visit.  Jill Alexanders MD 04/30/2021 11:47 AM  [facility] [address] [place], [place] [address]  Phone [phone] Fax [phone]

## 2021-05-01 LAB — HOMOCYSTEINE: Homocysteine: 10.4 umol/L (ref 0.0–14.5)

## 2021-06-18 ENCOUNTER — Ambulatory Visit: Payer: Managed Care, Other (non HMO) | Attending: Internal Medicine

## 2021-06-18 DIAGNOSIS — Z23 Encounter for immunization: Secondary | ICD-10-CM

## 2021-06-19 NOTE — Progress Notes (Signed)
   Covid-19 Vaccination Clinic  Name:  Pennelope Basque    MRN: [id number] DOB: [birth year]  06/19/2021  Ms. Korb was observed post Covid-19 immunization for 15 minutes without incident. She was provided with Vaccine Information Sheet and instruction to access the V-Safe system.   Ms. Quesinberry was instructed to call 911 with any severe reactions post vaccine: Difficulty breathing  Swelling of face and throat  A fast heartbeat  A bad rash all over body  Dizziness and weakness   Immunizations Administered     Name Date Dose VIS Date Route   Pfizer Covid-19 Vaccine Bivalent Booster 06/18/2021  2:37 PM 0.3 mL 04/01/2021 Intramuscular   Manufacturer: [place]   Lot: [id number]   [place]: [phone]

## 2021-06-23 ENCOUNTER — Other Ambulatory Visit: Payer: Managed Care, Other (non HMO)

## 2021-07-01 ENCOUNTER — Ambulatory Visit (HOSPITAL_COMMUNITY)
Admission: EM | Admit: 2021-07-01 | Discharge: 2021-07-01 | Disposition: A | Discharge status: Home / Self Care | Payer: Managed Care, Other (non HMO) | Attending: Urgent Care | Admitting: Urgent Care

## 2021-07-01 ENCOUNTER — Other Ambulatory Visit: Payer: Self-pay

## 2021-07-01 ENCOUNTER — Encounter (HOSPITAL_COMMUNITY): Payer: Self-pay | Admitting: *Deleted

## 2021-07-01 DIAGNOSIS — Z889 Allergy status to unspecified drugs, medicaments and biological substances status: Secondary | ICD-10-CM

## 2021-07-01 DIAGNOSIS — R140 Abdominal distension (gaseous): Secondary | ICD-10-CM | POA: Diagnosis not present

## 2021-07-01 MED ORDER — HYDROXYZINE HCL 25 MG PO TABS: 12.5000 mg | ORAL_TABLET | Freq: Three times a day (TID) | ORAL | 0 refills | Status: DC | PRN

## 2021-07-01 NOTE — ED Triage Notes (Signed)
Pt reports she had a b12 shot 80mins ago. Pt reports ABD swelling and SHOB. Pt has a hx of allergic reaction with ABD swelling.

## 2021-07-01 NOTE — ED Provider Notes (Signed)
[place]   MRN: [id number] DOB: 26-Jun-1966  Subjective:   Vanessa Porter is a 55 y.o. female presenting for possible allergic reaction. Had an at-home B12 injection done and also ate peas.  She is concerned that she had an allergic reaction due to abdominal bloating and swelling that she subsequently developed.  She also had puffiness around her eyes.  She took an Human resources officer and it has since improved since being here in the clinic.  Has a history of abdominal bloating, swelling that she thought might be allergy related.  Reports that she has had allergy testing.  Results showed that she did not have true allergies but has had plenty of food intolerances.  She does have a PCP, plans to follow-up with them soon.  No current facility-administered medications for this encounter.  Current Outpatient Medications:    albuterol (PROVENTIL HFA;VENTOLIN HFA) 108 (90 BASE) MCG/ACT inhaler, Inhale 2 puffs into the lungs every 6 (six) hours as needed for wheezing or shortness of breath. , Disp: , Rfl:    Cyanocobalamin (VITAMIN B 12 PO), by Other route. Injection monthly, Disp: , Rfl:    DIGESTIVE ENZYMES PO, Take 2 tablets by mouth with breakfast, with lunch, and with evening meal., Disp: , Rfl:    estradiol (ESTRACE) 0.1 MG/GM vaginal cream, PLEASE SEE ATTACHED FOR DETAILED DIRECTIONS, Disp: , Rfl:    Famotidine (PEPCID PO), Take by mouth., Disp: , Rfl:    polyethylene glycol powder (GLYCOLAX/MIRALAX) powder, Take 17 g by mouth 2 (two) times daily., Disp: , Rfl:    predniSONE (DELTASONE) 10 MG tablet, Begin taking 6 tablets daily, taper by one tablet every other day until off the medication., Disp: 42 tablet, Rfl: 0   UNABLE TO FIND, [employer] for health, Disp: , Rfl:    UNABLE TO FIND, Med Name: Supplements Urinary tract, bone guard, probiotic, digestive enzymes,, Disp: , Rfl:    VITAMIN D PO, Take 5,000 Units by mouth., Disp: , Rfl:    Allergies  Allergen Reactions    Aspirin Anaphylaxis   Compazine Anaphylaxis   Fructose Anaphylaxis   Midazolam Hcl Anaphylaxis   Omnipaque [Iohexol] Anaphylaxis    Per pt required epi pen   Other Anaphylaxis    Numbing medications. Does not remember name of. Pt tolerated Tetracaine 04/14/21 for LP.   Permethrin Shortness Of Breath   Sulfa Antibiotics Hives    Other reaction(s): Unknown   Xylitol Swelling    Swelling of arms, face    Amino Acids Other (See Comments)    Pt is avoiding these in her diet.    Dairy Aid [Tilactase] Other (See Comments)    Reaction:  Stomach upset    Doxycycline Other (See Comments)    Reaction:  Vision issues    Hyaluronic Acid [Sodium Hyaluronate]     Gel- Injections    Indomethacin Other (See Comments)    Reaction:  Headaches that caused white matter in the brain to change.    Nickel     Other reaction(s): Other (See Comments) Green wrist after wearing a bracelet, pussy ears with gold and rash. Inflammation around gold tooth around her gum line.   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)    Small intestinal bacterial overgrowth (SIBO)    Venous malformation      Past Surgical History:  Procedure Laterality Date   CYSTOCELE REPAIR     KNEE SURGERY     RECTOCELE REPAIR     TONSILLECTOMY     vaginal sling      Family History  Problem Relation Age of Onset   Hypertension Mother    CAD Father    Stroke Sister    Cancer Sister     Social History   Tobacco Use   Smoking status: Never   Smokeless tobacco: Never  Substance Use Topics   Alcohol use: No   Drug use: No    ROS   Objective:   Vitals: BP 110/70 (BP Location: Left Arm)   Pulse 60   Temp 99.1 F  (37.3 C)   Resp (!) 22   LMP 03/02/2019   SpO2 97%   Physical Exam Constitutional:      General: She is not in acute distress.    Appearance: Normal appearance. She is well-developed. She is not ill-appearing, toxic-appearing or diaphoretic.  HENT:     Head: Normocephalic and atraumatic.     Right Ear: External ear normal. No drainage or tenderness. No middle ear effusion. Tympanic membrane is not erythematous.     Left Ear: External ear normal. No drainage or tenderness.  No middle ear effusion. Tympanic membrane is not erythematous.     Nose: Nose normal.     Mouth/Throat:     Mouth: Mucous membranes are moist. No oral lesions.     Pharynx: No pharyngeal swelling, oropharyngeal exudate, posterior oropharyngeal erythema or uvula swelling.     Tonsils: No tonsillar exudate or tonsillar abscesses.  Eyes:     General: No scleral icterus.       Right eye: No discharge.        Left eye: No discharge.     Extraocular Movements: Extraocular movements intact.     Right eye: Normal extraocular motion.     Left eye: Normal extraocular motion.     Conjunctiva/sclera: Conjunctivae normal.     Pupils: Pupils are equal, round, and reactive to light.  Cardiovascular:     Rate and Rhythm: Normal rate and regular rhythm.     Pulses: Normal pulses.     Heart sounds: Normal heart sounds. No murmur heard.   No friction rub. No gallop.  Pulmonary:     Effort: Pulmonary effort is normal. No respiratory distress.     Breath sounds: Normal breath sounds. No stridor. No wheezing, rhonchi or rales.  Musculoskeletal:     Cervical back: Normal range of motion and neck supple.  Lymphadenopathy:     Cervical: No cervical adenopathy.  Skin:    General: Skin is warm and dry.     Findings: No rash.  Neurological:     General: No focal deficit present.     Mental Status: She is alert and oriented to person, place, and time.  Psychiatric:        Mood and Affect: Mood normal.        Behavior: Behavior  normal.        Thought Content: Thought content normal.        Judgment: Judgment normal.    Assessment and Plan :   PDMP not reviewed this encounter.  1. Abdominal bloating   2.  History of allergic reaction    No evidence of an allergic reaction.  Physical exam is very reassuring.  Offered her hydroxyzine for any possible allergic reaction.  Recommended close follow-up with her PCP. Counseled patient on potential for adverse effects with medications prescribed/recommended today, ER and return-to-clinic precautions discussed, patient verbalized understanding.    Jaynee Eagles, PA-C 07/01/21 1721

## 2021-07-04 ENCOUNTER — Other Ambulatory Visit (HOSPITAL_BASED_OUTPATIENT_CLINIC_OR_DEPARTMENT_OTHER): Payer: Self-pay

## 2021-07-04 MED ORDER — PFIZER COVID-19 VAC BIVALENT 30 MCG/0.3ML IM SUSP
INTRAMUSCULAR | 0 refills | Status: DC
  Filled 2021-07-04: qty 0.3, 1d supply, fill #0

## 2021-07-06 ENCOUNTER — Other Ambulatory Visit (HOSPITAL_BASED_OUTPATIENT_CLINIC_OR_DEPARTMENT_OTHER): Payer: Self-pay

## 2021-07-23 ENCOUNTER — Telehealth: Payer: Self-pay | Admitting: Neurology

## 2021-07-23 NOTE — Telephone Encounter (Signed)
Pt called, went to the [facility] with dizziness, chest , headache .EEG showed had not had a stroke.

## 2021-07-29 ENCOUNTER — Encounter: Payer: Self-pay | Admitting: Cardiology

## 2021-08-11 ENCOUNTER — Ambulatory Visit: Payer: Managed Care, Other (non HMO) | Admitting: Neurology

## 2021-08-12 ENCOUNTER — Ambulatory Visit: Payer: Managed Care, Other (non HMO) | Admitting: Cardiology

## 2021-08-12 ENCOUNTER — Encounter: Payer: Self-pay | Admitting: Cardiology

## 2021-08-12 ENCOUNTER — Other Ambulatory Visit: Payer: Self-pay

## 2021-08-12 ENCOUNTER — Inpatient Hospital Stay: Payer: Managed Care, Other (non HMO)

## 2021-08-12 VITALS — Temp 98.0°F | Resp 17 | Ht 64.0 in | Wt 160.0 lb

## 2021-08-12 DIAGNOSIS — R002 Palpitations: Secondary | ICD-10-CM

## 2021-08-12 DIAGNOSIS — R55 Syncope and collapse: Secondary | ICD-10-CM | POA: Insufficient documentation

## 2021-08-12 DIAGNOSIS — R42 Dizziness and giddiness: Secondary | ICD-10-CM | POA: Insufficient documentation

## 2021-08-12 DIAGNOSIS — R0789 Other chest pain: Secondary | ICD-10-CM

## 2021-08-12 NOTE — Progress Notes (Signed)
Patient referred by Tawni Carnes, MD for chest tightness, dizziness  Subjective:   Vanessa Porter, female    DOB: Sep 12, 1965, 56 y.o.   MRN: [id number]   Chief Complaint  Patient presents with   Dizziness   Chest Pain   New Patient (Initial Visit)     HPI  56 y.o. Caucasian female with mixed hyperlipidemia, family history of CAD, h/o breast cancer, fibromyalgia, frontal lobe venous malformation, GERD, dysphonia, recurrent syncope  Patient has undergone multiple recent work-ups regarding dizziness and vestibular dysfunction.  This is reportedly been unrevealing.  She reports lightheadedness, standing up, feels like her head is unremarkable.  Separately, she has also had work-up for chest pain with multiple prior ER visits and stress test, which have been all negative.  Chest pain is unrelated to exertion.  it usually is related to eating.    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)    Small intestinal bacterial overgrowth (SIBO)    Venous malformation      Past Surgical History:  Procedure Laterality Date   CYSTOCELE REPAIR     KNEE SURGERY     RECTOCELE REPAIR     TONSILLECTOMY     vaginal sling       Social History   Tobacco Use  Smoking Status Never  Smokeless Tobacco Never    Social History   Substance and Sexual Activity  Alcohol Use No     Family History  Problem Relation Age of Onset   Hypertension Mother    CAD Father    Stroke Sister    Cancer Sister      Current Outpatient Medications on File Prior to Visit  Medication Sig Dispense Refill   albuterol (PROVENTIL HFA;VENTOLIN HFA) 108 (90 BASE) MCG/ACT inhaler Inhale 2 puffs into the lungs every 6 (six) hours as needed for wheezing or shortness of breath.      COVID-19 mRNA bivalent  vaccine, Pfizer, (PFIZER COVID-19 VAC BIVALENT) injection Inject into the muscle. 0.3 mL 0   Cyanocobalamin (VITAMIN B 12 PO) by Other route. Injection monthly     DIGESTIVE ENZYMES PO Take 2 tablets by mouth with breakfast, with lunch, and with evening meal.     estradiol (ESTRACE) 0.1 MG/GM vaginal cream PLEASE SEE ATTACHED FOR DETAILED DIRECTIONS     Famotidine (PEPCID PO) Take by mouth.     hydrOXYzine (ATARAX) 25 MG tablet Take 0.5-1 tablets (12.5-25 mg total) by mouth every 8 (eight) hours as needed for itching. 30 tablet 0   polyethylene glycol powder (GLYCOLAX/MIRALAX) powder Take 17 g by mouth 2 (two) times daily.     predniSONE (DELTASONE) 10 MG tablet Begin taking 6 tablets daily, taper by one tablet every other day until off the medication. 42 tablet 0   UNABLE TO FIND [employer] for health     UNABLE TO FIND Med Name: Supplements Urinary tract, bone guard, probiotic, digestive enzymes,     VITAMIN D PO Take 5,000 Units by mouth.     [DISCONTINUED] Levonorgestrel (MIRENA IU) 1 each by Intrauterine route once.     [DISCONTINUED] metoCLOPramide (REGLAN) 10 MG tablet Take 1 tablet (10 mg total) by mouth every 6 (six) hours. 10 tablet 0   [DISCONTINUED] mometasone (  NASONEX) 50 MCG/ACT nasal spray Place 2 sprays into the nose 2 (two) times daily.     No current facility-administered medications on file prior to visit.    Cardiovascular and other pertinent studies:  EKG 08/12/2021: Sinus  Rhythm  RSR(V1) -nondiagnostic.  Nonspecific T-abnormality.    Recent labs: 07/2021: Glucose 92, BUN/Cr 15/0.72. EGFR >90. Na/K 141/3.9. Rest of the CMP normal H/H 14/42. MCV 86. Platelets 212  04/23/2021: Glucose 103, BUN/Cr 22/1.0. EGFR >60. Na/K 137/4.4. Rest of the CMP normal H/H 14/43. MCV 88. Platelets 268 HbA1C 5.8% Chol 216, TG 52, HDL 64, LDL 142 TSH 2.3 normal ANA +ve   Review of Systems  Cardiovascular:  Positive for chest pain. Negative for dyspnea on exertion,  leg swelling, palpitations and syncope.  Neurological:  Positive for dizziness and light-headedness.        Vitals:   08/12/21 1023 08/12/21 1024  Resp:    Temp:    SpO2: 98% 98%   Orthostatic VS for the past 72 hrs (Last 3 readings):  Orthostatic BP Patient Position BP Location Cuff Size Orthostatic Pulse  08/12/21 1024 100/60 Standing Left Arm Normal 74  08/12/21 1023 128/88 Sitting Left Arm Normal 71  08/12/21 1014 130/88 Supine Left Arm Normal 61    Body mass index is 27.46 kg/m. Filed Weights   08/12/21 1014  Weight: 160 lb (72.6 kg)     Objective:   Physical Exam Vitals and nursing note reviewed.  Constitutional:      General: She is not in acute distress. Neck:     Vascular: No JVD.  Cardiovascular:     Rate and Rhythm: Normal rate and regular rhythm.     Pulses: Normal pulses.     Heart sounds: Normal heart sounds. No murmur heard. Pulmonary:     Effort: Pulmonary effort is normal.     Breath sounds: Normal breath sounds. No wheezing or rales.  Musculoskeletal:     Right lower leg: No edema.     Left lower leg: No edema.       Assessment & Recommendations:   56 y.o. Caucasian female with mixed hyperlipidemia, family history of CAD, h/o breast cancer, fibromyalgia, frontal lobe venous malformation, GERD, dysphonia, recurrent syncope  Dizziness, lightheadedness: Vitals positive for orthostatic hypotension.  Suspect her symptoms are related to the same.  Recommend hydration with 3 to 4 L of water every day.  Patient admits that she does not drink much water.  She is comfortable wearing compression stockings due to previous side effects.  Encourage ambulation and walking.  Chest pain: Atypical.  She does have risk factors of mixed hyperlipidemia and family history of CAD.  Recommend CT cardiac scoring.  If calcium score is 0, no further work-up necessary.  If calcium score is elevated, will consider CT coronary angiography for definitive coronary  evaluation.  Further recommendations after above testing  Thank you for referring the patient to [place]. Please feel free to contact with any questions.   Nigel Mormon, MD Pager: [phone] Office: [phone]

## 2021-08-14 ENCOUNTER — Encounter: Payer: Self-pay | Admitting: Student

## 2021-08-18 ENCOUNTER — Ambulatory Visit: Payer: Managed Care, Other (non HMO) | Admitting: Neurology

## 2021-08-18 ENCOUNTER — Encounter: Payer: Self-pay | Admitting: Neurology

## 2021-08-18 ENCOUNTER — Telehealth: Payer: Self-pay

## 2021-08-18 VITALS — BP 112/72 | HR 64 | Ht 64.0 in | Wt 161.5 lb

## 2021-08-18 DIAGNOSIS — G43709 Chronic migraine without aura, not intractable, without status migrainosus: Secondary | ICD-10-CM | POA: Diagnosis not present

## 2021-08-18 DIAGNOSIS — R9389 Abnormal findings on diagnostic imaging of other specified body structures: Secondary | ICD-10-CM | POA: Diagnosis not present

## 2021-08-18 MED ORDER — UBRELVY 50 MG PO TABS: ORAL_TABLET | ORAL | 11 refills | Status: DC

## 2021-08-18 NOTE — Progress Notes (Addendum)
Chief Complaint  Patient presents with   Follow-up    Rm 15. Alone. PCP is Dr. Earnestine Mealing. Pt reports electrical zinging right leg.  Reports muscle twitching, disorientation, and difficulty multi-tasking. She c/o dizziness, headaches, and tightness in chest. Headaches are in a "headband" formation across head.      ASSESSMENT AND PLAN  Vanessa Porter is a 56 y.o. female   Abnormal MRI of the brain  Most recent MRI of the brain with without contrast was in September 2021, large right frontal venous anomaly, numerous supratentorium T2/FLAIR hyperdensity lesion, recess suspicious for multiple sclerosis, spinal fluid testing in September 2022 showed no significant abnormality, 0 oligoclonal banding,  Patient has constellation of complaints, but there was no clear relapsing remitting course, functioning well,  Repeat MRI of the brain with without contrast Chronic migraine headaches  I have suggested nortriptyline versus Effexor as preventive medications  Ubrelvy 50 mg as needed for abortive treatment,   DIAGNOSTIC DATA (LABS, IMAGING, TESTING) - I reviewed patient records, labs, notes, testing and imaging myself where available.   MEDICAL HISTORY:  Vanessa Porter is a 56 year old female, seen in request by her primary care physician Dr. Earnestine Mealing for evaluation of intermittent arm pain, disorientation, headaches, tightness in chest, she was seen by Dr. Jannifer Franklin in the past, last visit was in September 2022  I reviewed and summarized the referring note.  Hypothyroidism, on supplement  Today she has constellation of complaints, on multiple over-the-counter supplement, reported multiple previous history of head trauma, concussion,  Most recent incident was in October 2020, lid at the trunk of the car fell, hit the back of her head, she had frequent dizziness since then, went through rehabilitation, with some improvement,  She also developed COVID in February 2022, with  recurrent dizziness, even disorientation at the height of COVID symptoms, now has improved, also complains of blurry vision,  Reported cardiology evaluation, wearing cardiac monitoring  She continues to be active, exercise regularly, but seems to have less stamina, sometimes felt chest pressure, breathing,  Today she is also very concerned about her frequent headaches, bilateral frontal (extending to occipital upper nuchal region, has to lie down resting for a while, tried over-the-counter medication Tylenol and ibuprofen does help, sometimes more severe headache will be localized to the lateralized retro-orbital region with light, noise sensitivity   Personally reviewed MRI of brain with and without contrast in September 2021, numerous hyperintensity T2 signal within the supratentorium white matter, suggestive of demyelinating disease, 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 with adjacent gliosis MRI of cervical spine was unremarkable  MRI lumbar spine September 2022, showed no significant abnormality  Spinal fluid testing September 2022: WBC 1, glucose 59, total protein of 35, 0 oligoclonal banding,  Laboratory evaluation showed normal homocystine level of 10.4, normal CBC, CMP, Lyme titer, angiotensin-converting enzyme, protein electrophoresis, copper, positive ANA, vitamin D 42, normal TSH, R42 706, folic acid, C3J 5.8   PHYSICAL EXAM:   Vitals:   08/18/21 1000  BP: 112/72  Pulse: 64  Weight: 161 lb 8 oz (73.3 kg)  Height: 5\' 4"  (1.626 m)   Not recorded     Body mass index is 27.72 kg/m.  PHYSICAL EXAMNIATION:  Gen: NAD, conversant, well nourised, well groomed                     Cardiovascular: Regular rate rhythm, no peripheral edema, warm, nontender. Eyes: Conjunctivae clear without exudates or hemorrhage Neck: Supple,  no carotid bruits. Pulmonary: Clear to auscultation bilaterally   NEUROLOGICAL EXAM:  MENTAL STATUS: Speech:     Speech is normal; fluent and spontaneous with normal comprehension.  Cognition:     Orientation to time, place and person     Normal recent and remote memory     Normal Attention span and concentration     Normal Language, naming, repeating,spontaneous speech     Fund of knowledge   CRANIAL NERVES: CN II: Visual fields are full to confrontation. Pupils are round equal and briskly reactive to light. CN III, IV, VI: extraocular movement are normal. No ptosis. CN V: Facial sensation is intact to light touch CN VII: Face is symmetric with normal eye closure  CN VIII: Hearing is normal to causal conversation. CN IX, X: Phonation is normal. CN XI: Head turning and shoulder shrug are intact  MOTOR: There is no pronator drift of out-stretched arms. Muscle bulk and tone are normal. Muscle strength is normal.  REFLEXES: Reflexes are 2+ and symmetric at the biceps, triceps, knees, and ankles. Plantar responses are flexor.  SENSORY: Intact to light touch, pinprick and vibratory sensation are intact in fingers and toes.  COORDINATION: There is no trunk or limb dysmetria noted.  GAIT/STANCE: Posture is normal. Gait is steady with normal steps, base, arm swing, and turning. Heel and toe walking are normal. Tandem gait is normal.  Romberg is absent.  REVIEW OF SYSTEMS:  Full 14 system review of systems performed and notable only for as above All other review of systems were negative.   ALLERGIES: Allergies  Allergen Reactions   Aspirin Anaphylaxis   Compazine Anaphylaxis   Fructose Anaphylaxis   Lidocaine     Other reaction(s): Angioedema   Midazolam Hcl Anaphylaxis   Omnipaque [Iohexol] Anaphylaxis    Per pt required epi pen   Other Anaphylaxis    Numbing medications. Does not remember name of. Pt tolerated Tetracaine 04/14/21 for LP.   Permethrin Shortness Of Breath   Sulfa Antibiotics Hives    Other reaction(s): Unknown   Xylitol Swelling    Swelling of arms, face    Amino  Acids Other (See Comments)    Pt is avoiding these in her diet.    Bupivacaine     Other reaction(s): Other (See Comments), Other (See Comments) Felt out of it x 24 hours Felt out of it x 24 hours    Codeine     Doesn't work d/t metabolism problems. Doesn't work d/t metabolism problems.    Dairy Aid [Tilactase] Other (See Comments)    Reaction:  Stomach upset    Doxycycline Other (See Comments)    Reaction:  Vision issues    Hyaluronic Acid [Sodium Hyaluronate]     Gel- Injections    Indomethacin Other (See Comments)    Reaction:  Headaches that caused white matter in the brain to change.    Nickel     Other reaction(s): Other (See Comments) Green wrist after wearing a bracelet, pussy ears with gold and rash. Inflammation around gold tooth around her gum line.   Pneumococcal Vaccines Swelling   Feldene [Piroxicam] Rash   Latex Rash   Milk [Lac Bovis] Rash    HOME MEDICATIONS: Current Outpatient Medications  Medication Sig Dispense Refill   acetaminophen (TYLENOL) 650 MG CR tablet Take by mouth.     albuterol (PROVENTIL HFA;VENTOLIN HFA) 108 (90 BASE) MCG/ACT inhaler Inhale 2 puffs into the lungs every 6 (six) hours as needed for wheezing or shortness  of breath.      Cyanocobalamin (VITAMIN B 12 PO) by Other route. Injection monthly     diphenhydrAMINE (SOMINEX) 25 MG tablet Take by mouth.     EPINEPHrine 0.3 mg/0.3 mL IJ SOAJ injection      estradiol (ESTRACE) 0.1 MG/GM vaginal cream PLEASE SEE ATTACHED FOR DETAILED DIRECTIONS     Famotidine (PEPCID PO) Take by mouth.     fexofenadine (ALLEGRA) 180 MG tablet Take by mouth.     hydrOXYzine (ATARAX) 25 MG tablet Take 0.5-1 tablets (12.5-25 mg total) by mouth every 8 (eight) hours as needed for itching. 30 tablet 0   Levothyroxine Sodium 88 MCG CAPS Take 1 tablet by mouth daily.     polyethylene glycol powder (GLYCOLAX/MIRALAX) powder Take 17 g by mouth 2 (two) times daily.     UNABLE TO FIND GI Microbics by [employer] for health      UNABLE TO FIND Med Name: Supplements Urinary tract, bone guard, probiotic, digestive enzymes,     VITAMIN D PO Take 5,000 Units by mouth.     DIGESTIVE ENZYMES PO Take 2 tablets by mouth with breakfast, with lunch, and with evening meal. (Patient not taking: Reported on 08/18/2021)     No current facility-administered medications for this visit.    PAST MEDICAL HISTORY: Past Medical History:  Diagnosis Date   Arthritis    Breast cancer ([place])    Fibromyalgia    Fructose malabsorption    Hypermobile joints    Hypothyroid    Lymphedema    MTHFR (methylene THF reductase) deficiency and homocystinuria (HCC)    Small intestinal bacterial overgrowth (SIBO)    Venous malformation     PAST SURGICAL HISTORY: Past Surgical History:  Procedure Laterality Date   CYSTOCELE REPAIR     KNEE SURGERY     RECTOCELE REPAIR     TONSILLECTOMY     vaginal sling      FAMILY HISTORY: Family History  Problem Relation Age of Onset   Hypertension Mother    Heart failure Father    CAD Father    Atrial fibrillation Father    Hypertension Sister    Stroke Sister    Cancer Sister     SOCIAL HISTORY: Social History   Socioeconomic History   Marital status: Married    Spouse name: Not on file   Number of children: 3   Years of education: Not on file   Highest education level: Not on file  Occupational History   Occupation: retired  Tobacco Use   Smoking status: Never   Smokeless tobacco: Never  Vaping Use   Vaping Use: Never used  Substance and Sexual Activity   Alcohol use: No   Drug use: No   Sexual activity: Never    Birth control/protection: None  Other Topics Concern   Not on file  Social History Narrative   Lives husband and daughter   Temporily staying with mom and dad who are assisting to take care of her, she can't stay at home by herself, daughter and husband work to much to take care of her   Right handed   Drinks no caffeine daily   Social Determinants of Health    Financial Resource Strain: Not on file  Food Insecurity: Not on file  Transportation Needs: Not on file  Physical Activity: Not on file  Stress: Not on file  Social Connections: Not on file  Intimate Partner Violence: Not on file    Total time spent reviewing  the chart, obtaining history, examined patient, ordering tests, documentation, consultations and family, care coordination was 56 minues    Addendum September 04, 2021: I received dropped off record from patient, reported that her symptoms are waxing and wanes, keep the record on file for next office visit in July 2023      Marcial Pacas, M.D. Ph.D.  [facility] [address] [place], [place] [address] Ph: [phone] Fax: [phone]  CC:  [place] [address] [place],  [place] [address]  Tawni Carnes, MD

## 2021-08-18 NOTE — Patient Instructions (Signed)
Migraine Prevention  Nortriptyline 10mg  every night  Effexor xr 37.5mg  daily

## 2021-08-18 NOTE — Telephone Encounter (Signed)
I submitted a PA request for Ubrelvy 50mg  on CMM, Key: [id number] - PA Case ID: [id number]. Awaiting determination from [facility]

## 2021-08-24 ENCOUNTER — Telehealth: Payer: Self-pay | Admitting: Neurology

## 2021-08-24 ENCOUNTER — Telehealth: Payer: Self-pay

## 2021-08-24 NOTE — Telephone Encounter (Signed)
Pt would like a call from the nurse to discuss LPN blood patch. Can blood patch cause the headaches and dizziness. Standing gets dizzy and laying down cause a pounding headache.

## 2021-08-24 NOTE — Telephone Encounter (Signed)
I don't think insurance will approve another monitor for that reason. I will still be able to to see the heart rhythm, even though she did not press the trigger button.   Thanks MJP

## 2021-08-24 NOTE — Telephone Encounter (Signed)
Left message for a return call

## 2021-08-24 NOTE — Telephone Encounter (Signed)
Patient called and is worried that she is wearing a heart monitor for 2 more days and wasn't told to hit the trigger button. She wants to wear another monitor, because she wasn't able to trigger the button for her palpitations. She is also having her cardiac ct on this coming Friday , please advise.

## 2021-08-24 NOTE — Telephone Encounter (Signed)
I spoke to the patient. She has continued to have headaches and dizziness. Seen on 08/18/21. She has a genetic counseling appt with the pharmacist at [facility] today. She will get their input before starting migraine medications.   She also has the following:  1) Mold in her house being cleaned.  2) Cardiology work-up in progress for orthostatic blood pressure (documented low pressures)  3) History of concussion.  ____________________________________  Extended call with the patient, expressing her multiple concerns over current health condition. Reports no real past headache history until concussion last year. Headaches resolved. Next, underwent LP to rule on MS in Sept 2022. 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 Feels like since this procedure, headaches and dizziness have worsened.  States she has read that a slow, persistent or intermittent CSF leak may present with the problems she is experiencing now. She would like her chart reviewed again by Dr. Krista Blue to see what her thoughts are on this matter. Also, asking if there is any way to confirm this is not the case.

## 2021-08-24 NOTE — Telephone Encounter (Signed)
I spoke to the patient. She is agreeable to this plan.

## 2021-08-24 NOTE — Telephone Encounter (Signed)
Patient insists on wearing another monitor, I spoke with anabell we can out another one on for her free of charge and zio can combine reports.

## 2021-08-24 NOTE — Telephone Encounter (Signed)
She is on schedule for MRI of the brain on August 27, 2021, will review the result then to see if there are any changes

## 2021-08-27 ENCOUNTER — Encounter: Payer: Self-pay | Admitting: Neurology

## 2021-08-27 ENCOUNTER — Other Ambulatory Visit: Payer: Self-pay | Admitting: Neurology

## 2021-08-27 ENCOUNTER — Ambulatory Visit
Admission: RE | Admit: 2021-08-27 | Discharge: 2021-08-27 | Disposition: A | Discharge status: Home / Self Care | Payer: Managed Care, Other (non HMO) | Source: Ambulatory Visit | Attending: Neurology | Admitting: Neurology

## 2021-08-27 ENCOUNTER — Other Ambulatory Visit: Payer: Self-pay

## 2021-08-27 DIAGNOSIS — G43709 Chronic migraine without aura, not intractable, without status migrainosus: Secondary | ICD-10-CM

## 2021-08-27 DIAGNOSIS — R9389 Abnormal findings on diagnostic imaging of other specified body structures: Secondary | ICD-10-CM

## 2021-08-27 MED ORDER — SUMATRIPTAN SUCCINATE 25 MG PO TABS: ORAL_TABLET | ORAL | 5 refills | Status: DC

## 2021-08-27 NOTE — Telephone Encounter (Signed)
Vanessa Porter denied due to patient never trying a triptan.  Formulary medications: sumatriptan, rizatriptan, zolmitriptan, naratiptan, frovatriptan, eletriptan, almotriptan.

## 2021-08-27 NOTE — Telephone Encounter (Signed)
I have [place] patient, CGRP antagonist was denied, will start Imitrex 25 mg as needed

## 2021-08-27 NOTE — Addendum Note (Signed)
Addended by: Marcial Pacas on: 08/27/2021 08:41 AM   Modules accepted: Orders

## 2021-09-01 ENCOUNTER — Other Ambulatory Visit: Payer: Self-pay

## 2021-09-01 ENCOUNTER — Ambulatory Visit
Admission: RE | Admit: 2021-09-01 | Discharge: 2021-09-01 | Disposition: A | Discharge status: Home / Self Care | Payer: Managed Care, Other (non HMO) | Source: Ambulatory Visit | Attending: Cardiology | Admitting: Cardiology

## 2021-09-01 DIAGNOSIS — R0789 Other chest pain: Secondary | ICD-10-CM

## 2021-09-03 ENCOUNTER — Other Ambulatory Visit: Payer: Managed Care, Other (non HMO)

## 2021-09-03 ENCOUNTER — Other Ambulatory Visit: Payer: Self-pay

## 2021-09-03 ENCOUNTER — Ambulatory Visit
Admission: RE | Admit: 2021-09-03 | Discharge: 2021-09-03 | Disposition: A | Discharge status: Home / Self Care | Payer: Managed Care, Other (non HMO) | Source: Ambulatory Visit | Attending: Neurology | Admitting: Neurology

## 2021-09-03 DIAGNOSIS — G43709 Chronic migraine without aura, not intractable, without status migrainosus: Secondary | ICD-10-CM

## 2021-09-03 DIAGNOSIS — R9389 Abnormal findings on diagnostic imaging of other specified body structures: Secondary | ICD-10-CM | POA: Diagnosis not present

## 2021-09-03 DIAGNOSIS — R002 Palpitations: Secondary | ICD-10-CM

## 2021-09-03 MED ORDER — GADOBENATE DIMEGLUMINE 529 MG/ML IV SOLN
15.0000 mL | Freq: Once | INTRAVENOUS | Status: AC | PRN
  Administered 2021-09-03: 15 mL via INTRAVENOUS

## 2021-09-14 NOTE — Progress Notes (Signed)
Patient referred by Tawni Carnes, MD for chest tightness, dizziness  Subjective:   Vanessa Porter, female    DOB: November 26, 1965, 57 y.o.   MRN: [id number]   Chief Complaint  Patient presents with   Follow-up    6-8 WEEKS     HPI  56 y.o. Caucasian female with mixed hyperlipidemia, family history of CAD, h/o breast cancer, fibromyalgia, frontal lobe venous malformation, GERD, dysphonia, recurrent syncope  Dizziness and lightheadedness symptoms have not improved.  No significant exertional chest pain symptoms. Reviewed recent test results with the patient, details below.    Initial consultation visit 08/2021: Patient has undergone multiple recent work-ups regarding dizziness and vestibular dysfunction.  This is reportedly been unrevealing.  She reports lightheadedness, standing up, feels like her head is unremarkable.  Separately, she has also had work-up for chest pain with multiple prior ER visits and stress test, which have been all negative.  Chest pain is unrelated to exertion.  it usually is related to eating.    Current Outpatient Medications on File Prior to Visit  Medication Sig Dispense Refill   acetaminophen (TYLENOL) 650 MG CR tablet Take by mouth.     albuterol (PROVENTIL HFA;VENTOLIN HFA) 108 (90 BASE) MCG/ACT inhaler Inhale 2 puffs into the lungs every 6 (six) hours as needed for wheezing or shortness of breath.      Cyanocobalamin (VITAMIN B 12 PO) by Other route. Injection monthly     DIGESTIVE ENZYMES PO Take 2 tablets by mouth with breakfast, with lunch, and with evening meal. (Patient not taking: Reported on 08/18/2021)     diphenhydrAMINE (SOMINEX) 25 MG tablet Take by mouth.     EPINEPHrine 0.3 mg/0.3 mL IJ SOAJ injection      estradiol (ESTRACE) 0.1 MG/GM vaginal cream PLEASE SEE ATTACHED FOR DETAILED DIRECTIONS     Famotidine (PEPCID PO) Take by mouth.     fexofenadine (ALLEGRA) 180 MG tablet Take by mouth.     hydrOXYzine (ATARAX) 25 MG tablet Take 0.5-1  tablets (12.5-25 mg total) by mouth every 8 (eight) hours as needed for itching. 30 tablet 0   Levothyroxine Sodium 88 MCG CAPS Take 1 tablet by mouth daily.     polyethylene glycol powder (GLYCOLAX/MIRALAX) powder Take 17 g by mouth 2 (two) times daily.     SUMAtriptan (IMITREX) 25 MG tablet May repeat in 2 hours if headache persists or recurs. 10 tablet 5   Ubrogepant (UBRELVY) 50 MG TABS Take 1 tab at onset of migraine.  May repeat in 2 hrs, if needed.  Max dose: 2 tabs/day. This is a 30 day prescription. 10 tablet 11   UNABLE TO FIND GI Microb[employer] by [employer] for health     UNABLE TO FIND Med Name: Supplements Urinary tract, bone guard, probiotic, digestive enzymes,     VITAMIN D PO Take 5,000 Units by mouth.     [DISCONTINUED] Levonorgestrel (MIRENA IU) 1 each by Intrauterine route once.     [DISCONTINUED] metoCLOPramide (REGLAN) 10 MG tablet Take 1 tablet (10 mg total) by mouth every 6 (six) hours. 10 tablet 0   [DISCONTINUED] mometasone (NASONEX) 50 MCG/ACT nasal spray Place 2 sprays into the nose 2 (two) times daily.     No current facility-administered medications on file prior to visit.    Cardiovascular and other pertinent studies:  Mobile cardiac telemetry 14 days 09/03/2021 - 09/17/2021: Dominant rhythm: Sinus. HR 40-140 bpm. Avg HR 74 bpm. 29 episodes of SVT, fastest at 133 bpm for 6  beats, longest for 18 beats at 108 bpm. <1% isolated SVE, couplet/triplets. 0 episodes of VT. <1% isolated VE, couplets. No atrial fibrillation/atrial flutter/VT/high grade AV block, sinus pause >3sec noted. 33 patient triggered events, most correlated with sinus rhythm/sinus tachcyardia without any arrhythmia Occasional episodes correlated with SVE/VE. SVT episodes did not correlate with reported symptoms.   Mobile cardiac telemetry 14 days 08/12/2021 - 08/26/2021: Dominant rhythm:Sinus. HR 41-143 bpm. Avg HR 76 bpm. 37 episodes of probable atrial tachycardia, fastest at 141 bpm for 4 beats,  longest for 19 beats at 111 bpm. True duration of Supraventricular Tachycardia difficult to ascertain due to artifact.  <1% isolated SVE, couplet/triplets. 0 episodes of VT <1% isolated VE, couplets. No triplets. No atrial fibrillation/atrial flutter/VT/high grade AV block, sinus pause >3sec noted. 11 patient triggered events, a few correlated with SVE.  EKG 08/12/2021: Sinus  Rhythm  RSR(V1) -nondiagnostic.  Nonspecific T-abnormality.    Recent labs: 07/2021: Glucose 92, BUN/Cr 15/0.72. EGFR >90. Na/K 141/3.9. Rest of the CMP normal H/H 14/42. MCV 86. Platelets 212  04/23/2021: Glucose 103, BUN/Cr 22/1.0. EGFR >60. Na/K 137/4.4. Rest of the CMP normal H/H 14/43. MCV 88. Platelets 268 HbA1C 5.8% Chol 216, TG 52, HDL 64, LDL 142 TSH 2.3 normal ANA +ve   Review of Systems  Cardiovascular:  Negative for chest pain, dyspnea on exertion, leg swelling, palpitations and syncope.  Neurological:  Positive for dizziness and light-headedness.        Vitals:   09/28/21 1155  BP: 136/82  Pulse: 70  Resp: 17  Temp: 98 F (36.7 C)  SpO2: 98%   Orthostatic VS for the past 72 hrs (Last 3 readings):  Orthostatic BP Patient Position BP Location Cuff Size Orthostatic Pulse  09/28/21 1217 100/80 Standing Left Arm Normal 79  09/28/21 1216 102/60 Sitting Left Arm Normal 71  09/28/21 1215 100/62 Supine Left Arm Normal 66     Body mass index is 28.18 kg/m. Filed Weights   09/28/21 1155  Weight: 164 lb 3.2 oz (74.5 kg)     Objective:   Physical Exam Vitals and nursing note reviewed.  Constitutional:      General: She is not in acute distress. Neck:     Vascular: No JVD.  Cardiovascular:     Rate and Rhythm: Normal rate and regular rhythm.     Pulses: Normal pulses.     Heart sounds: Normal heart sounds. No murmur heard. Pulmonary:     Effort: Pulmonary effort is normal.     Breath sounds: Normal breath sounds. No wheezing or rales.  Musculoskeletal:     Right lower leg:  No edema.     Left lower leg: No edema.       Assessment & Recommendations:   56 y.o. Caucasian female with mixed hyperlipidemia, family history of CAD, h/o breast cancer, fibromyalgia, frontal lobe venous malformation, GERD, dysphonia, recurrent syncope  Dizziness, lightheadedness: I suspect this was related to orthostatic hypotension, which has since resolved.  PSVT/atrial tachycardia: Short episodes, no correlated symptoms. Discussed vagal maneuvers.  No medications necessary at this time.  Chest pain: Atypical.  She does have risk factors of mixed hyperlipidemia and family history of CAD.  CT cardiac scoring results not available to me.  However, my overall suspicion remains low for ischemia.  Hold off any further testing at this time.  F/u in 6 months   Nigel Mormon, MD Pager: [phone] Office: [phone]

## 2021-09-28 ENCOUNTER — Other Ambulatory Visit: Payer: Self-pay

## 2021-09-28 ENCOUNTER — Encounter: Payer: Self-pay | Admitting: Cardiology

## 2021-09-28 ENCOUNTER — Ambulatory Visit: Payer: Managed Care, Other (non HMO) | Admitting: Cardiology

## 2021-09-28 VITALS — BP 136/82 | HR 70 | Temp 98.0°F | Resp 17 | Ht 64.0 in | Wt 164.2 lb

## 2021-09-28 DIAGNOSIS — R42 Dizziness and giddiness: Secondary | ICD-10-CM

## 2021-09-28 DIAGNOSIS — R55 Syncope and collapse: Secondary | ICD-10-CM

## 2021-11-25 ENCOUNTER — Telehealth: Payer: Self-pay | Admitting: Neurology

## 2021-11-25 DIAGNOSIS — R339 Retention of urine, unspecified: Secondary | ICD-10-CM | POA: Insufficient documentation

## 2021-11-25 NOTE — Telephone Encounter (Signed)
Pt states she cannot take the triptan because she is has cardiac issues, gut disease insufficient right leg.  ?Pt believes she needs to be taking the [place].  ?Pt has had a migraine for 3 weeks and burning in her hands. ? Would like a call back.  ?

## 2021-11-25 NOTE — Telephone Encounter (Signed)
Patient was last seen 08/18/21. Prescribed nortriptyline but never started due to concerns over potential side effects. Roselyn Meier sent it but insurance would not cover since she had never tried a triptan. Prescribed sumatriptan but did not feel comfortable trying the medication (concerned w/ family hx of cardiovascular risk). States no cardiac condition identified for treatment after wearing heart monitor (results in Epic). Reports having a negative vestibular workup at [facility]. She is getting treatment with massage. Pending new patient appt for acupuncture. Additionally, she wanted it noted she has urinary retention. She is seeing urology for this problem and has also starting tx with a pelvic floor therapist.  ? ?She scheduled an appt on 12/01/21 to come in to discuss other treatments for migraines.  ?

## 2021-11-25 NOTE — Telephone Encounter (Signed)
Pt is aware that insurance has denied Vanessa Porter because she has not tried triptan.  ?Pt states she cannot take triptan because she is cervical vascular disease,insufficiency in right leg, cardiac problems, and has gut disease. Pt claims these are all issues that would cause risk while taking triptan.  ? ? ?Pt states she has had a migraine for 3 weeks and burning in her hands would like something to help. ?Pt would like a call back.  ?

## 2021-12-01 ENCOUNTER — Ambulatory Visit: Payer: Managed Care, Other (non HMO) | Admitting: Neurology

## 2021-12-01 ENCOUNTER — Encounter: Payer: Self-pay | Admitting: Neurology

## 2021-12-01 VITALS — BP 132/76 | HR 74 | Ht 66.0 in | Wt 165.0 lb

## 2021-12-01 DIAGNOSIS — G43709 Chronic migraine without aura, not intractable, without status migrainosus: Secondary | ICD-10-CM | POA: Diagnosis not present

## 2021-12-01 DIAGNOSIS — R930 Abnormal findings on diagnostic imaging of skull and head, not elsewhere classified: Secondary | ICD-10-CM

## 2021-12-01 MED ORDER — ONDANSETRON 4 MG PO TBDP: 4.0000 mg | ORAL_TABLET | Freq: Three times a day (TID) | ORAL | 3 refills | Status: DC | PRN

## 2021-12-01 MED ORDER — BUTALBITAL-APAP-CAFFEINE 50-325-40 MG PO TABS: 1.0000 | ORAL_TABLET | Freq: Four times a day (QID) | ORAL | 3 refills | Status: DC | PRN

## 2021-12-01 MED ORDER — NORTRIPTYLINE HCL 10 MG PO CAPS: 20.0000 mg | ORAL_CAPSULE | Freq: Every day | ORAL | 11 refills | Status: DC

## 2021-12-01 MED ORDER — UBRELVY 50 MG PO TABS: ORAL_TABLET | ORAL | 11 refills | Status: DC

## 2021-12-01 NOTE — Patient Instructions (Signed)
Meds ordered this encounter  ?Medications  ? nortriptyline (PAMELOR) 10 MG capsule---Daily preventive medication  ?  Sig: Take 2 capsules (20 mg total) by mouth at bedtime. ? ?Options of daily migraine preventive medications including beta-blocker, such as Inderal,  ?Seizure medications, will try to use Topamax, Zonegran, ?Newer generation CGRP antagonist, Aimovig, Ajovy, ect,  subcutaneous injection once monthly ? ?  ? Abortive treatment ? ? ?butalbital-acetaminophen-caffeine (FIORICET) 50-325-40 MG tablet  ?  Sig: Take 1 tablet by mouth every 6 (six) hours as needed for headache. Take 1 tab at onset of migraine.  May repeat in 2 hrs, if needed.  Max dose: 2 tabs/day. This is a 30 day prescription. ? ?  ? Ubrogepant (UBRELVY) 50 MG TABS--TRY PA again  ?  Sig: Take 1 tab at onset of migraine.  May repeat in 2 hrs, if needed.  Max dose: 2 tabs/day. This is a 30 day prescription. ? ?  ? ondansetron (ZOFRAN-ODT) 4 MG disintegrating tablet  ?  Sig: Take 1 tablet (4 mg total) by mouth every 8 (eight) hours as needed for nausea or vomiting.  ?   ? ?For severe prolonged headaches, you may try Fioricet at the beginning of moderate to severe headache, may combine with Zofran for nausea, aleve, and sleep. ? ? ?

## 2021-12-01 NOTE — Progress Notes (Signed)
? ?Chief Complaint  ?Patient presents with  ? Follow-up  ?  Room 14, alone  ?Pt c/o headaches and dizziness   ? ? ? ? ?ASSESSMENT AND PLAN ? ?Vanessa Porter is a 56 y.o. female   ?Abnormal MRI of the brain ? Repeat MRI of the brain with and without contrast in February 2023, large right frontal venous anomaly, scattered supratentorium T2/FLAIR hyperdensity lesion, no contrast-enhancement, no change compared to previous scan in September 2021, spinal fluid testing in September 2022 showed no significant abnormality, 0 oligoclonal banding, ? Above findings most consistent with small vessel disease, commonly associated with chronic migraine headaches, does not support a diagnosis of relapsing remitting multiple sclerosis ?  ?Chronic migraine headaches ? Will try low-dose nortriptyline 10 mg every night titrating to 20 mg every night as preventive medications, ? Patient does not want to try triptan worried about the side effect, ? Fioricet as needed, may combine with Zofran for prolonged headaches, ? Ubrelvy 50 mg as needed for abortive treatment, ? ? ?DIAGNOSTIC DATA (LABS, IMAGING, TESTING) ?- I reviewed patient records, labs, notes, testing and imaging myself where available. ? ? ?MEDICAL HISTORY: ? ?Vanessa Porter is a 56 year old female, seen in request by her primary care physician Dr. Earnestine Mealing for evaluation of intermittent arm pain, disorientation, headaches, tightness in chest, she was seen by Dr. Jannifer Franklin in the past, last visit was in September 2022 ? ?I reviewed and summarized the referring note.  ?Hypothyroidism, on supplement ? ?Today she has constellation of complaints, on multiple over-the-counter supplement, reported multiple previous history of head trauma, concussion, ? ?Most recent incident was in October 2020, lid at the trunk of the car fell, hit the back of her head, she had frequent dizziness since then, went through rehabilitation, with some improvement, ? ?She also developed COVID in  February 2022, with recurrent dizziness, even disorientation at the height of COVID symptoms, now has improved, also complains of blurry vision, ? ?Reported cardiology evaluation, wearing cardiac monitoring ? ?She continues to be active, exercise regularly, but seems to have less stamina, sometimes felt chest pressure, breathing, ? ?Today she is also very concerned about her frequent headaches, bilateral frontal (extending to occipital upper nuchal region, has to lie down resting for a while, tried over-the-counter medication Tylenol and ibuprofen does help, sometimes more severe headache will be localized to the lateralized retro-orbital region with light, noise sensitivity ? ? 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 ?MRI lumbar spine September 2022, showed no significant abnormality ? ?Spinal fluid testing September 2022: WBC 1, glucose 59, total protein of 35, 0 oligoclonal banding, ? ?Laboratory evaluation showed normal homocystine level of 10.4, normal CBC, CMP, Lyme titer, angiotensin-converting enzyme, protein electrophoresis, copper, positive ANA, vitamin D 42, normal TSH, Q03 474, folic acid, Q5Z 5.8 ? ?UPDATE Dec 01 2021: ?Patient continue complains of frequent headaches, she brings her headache category, tends to cluster 6 to 7 days each month, doing the headache, she complains of vertex region pressure headaches, nausea, noise sensitivity, movement make her headache worse, headache last hours to days, ? ?She also complains of frequent dizziness sensation, even walking around the grocery isles, she felt lightheaded, motion sickness sensation afterwards, sometimes triggered a headache, she felt scalp skin sensitivity, tender to  touch, oftentimes  her headache also extending to her neck, ? ?Because of her headache has typical migraine features, also reported a history of migraine in her college years, then was lateralized severe pounding headache with light noise sensitivity, she was given low-dose Imitrex 25 mg as needed, she does not feel comfortable taking triptan due to her abnormal MRI brain findings, never tried it, Roselyn Meier was not approved by her insurance company ? ?She also wearing bilateral ankle brace for recent diagnosis of tendinitis, history of planta fasciitis, heel spur, difficulty bearing weight ? ?We personally reviewed MRI of the brain with without contrast in February 2023, compared to previous MRI in September 2021, 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, unchanged, right frontal subcortical T2/FLAIR hyperintensity lesion, scattered smaller subcortical hypodensity signal, no contrast-enhancement, no change compared to previous scan in 2021 ? ?Laboratory evaluation in February 2023 from [facility], normal CBC hemoglobin of 14.0, normal liver function laboratory anticardiolipin antibody, normal TSH 2.9, free T48.9, creatinine 0.81, ESR was within normal limits 24, CPK was normal at 54, C-reactive protein less than 1, low normal B12 271, she is on supplement, vitamin D level was normal at 42 ? ?X-ray of bilateral hands and feet showed no definite radiographic evidence of erosive arthropathy, minimal degenerative change in the first right IP joint, ? ?X-ray of SI joints showed very early degenerative changes in the lower SI joints ? ?X-ray of lumbar and thoracic showed mild degenerative changes  ? ?X-ray of cervical showed reversal of cervical lordosis, degenerative disc disease ? ?  ? ?PHYSICAL EXAM: ?  ?Vitals:  ? 12/01/21 1455  ?BP: 132/76  ?Pulse: 74  ?Weight: 165 lb (74.8 kg)  ?Height: _0  (1.676 m)  ? ?Not recorded ?  ? ? ?Body mass index is 26.63 kg/m?. ? ?PHYSICAL EXAMNIATION: ? ?Gen: NAD, conversant,  well nourised, well groomed                     ?Cardiovascular: Regular rate rhythm, no peripheral edema, warm, nontender. ?Eyes: Conjunctivae clear without exudates or hemorrhage ?Neck: Supple, no carotid bruits. ?Pulmonary: Clear to auscultation bilaterally  ? ?NEUROLOGICAL EXAM: ? ?MENTAL STATUS: ?Speech/cognition: ?Awake, alert, oriented to history taking and casual conversation ? ?CRANIAL NERVES: ?CN II: Pupils are round equal and briskly reactive to light. ?CN III, IV, VI: extraocular movement are normal. No ptosis.   ?Cervical spine: Facial sensation is intact to light touch ?CN VII: Face is symmetric with normal eye closure  ?CN VIII: Hearing is normal to causal conversation. ?CN IX, X: Phonation is normal. ?CN XI: Head turning and shoulder shrug are intact ? ?MOTOR: No weakness, wearing bilateral ankle brace, ? ?REFLEXES: ?Reflexes are hypoactive and symmetric at the biceps, triceps, knees  ? ?SENSORY: ?Intact to light touch, vibratory sensation ? ?COORDINATION: ?There is no trunk or limb dysmetria noted. ? ?GAIT/STANCE: Need push-up to get up from seated position, cautious, mildly antalgic ? ?REVIEW OF SYSTEMS:  ?Full 14 system review of systems performed and notable only for as above ?All other review of systems were negative. ? ? ?ALLERGIES: ?Allergies  ?Allergen Reactions  ? Aspirin Anaphylaxis  ? Compazine Anaphylaxis  ? Fructose Anaphylaxis  ? Lidocaine   ?  Other reaction(s): Angioedema  ? Midazolam Hcl Anaphylaxis  ? Omnipaque [Iohexol] Anaphylaxis  ?  Per pt required epi pen  ? Other Anaphylaxis  ?  Numbing medications. ?Does not remember name of. Pt tolerated Tetracaine 04/14/21 for LP.  ? Permethrin Shortness Of Breath  ?  Sulfa Antibiotics Hives  ?  Other reaction(s): Unknown  ? Xylitol Swelling  ?  Swelling of arms, face ?  ? Amino Acids Other (See Comments)  ?  Pt is avoiding these in her diet.   ? Bupivacaine   ?  Other reaction(s): Other (See Comments), Other (See Comments) ?Felt out of it x  24 hours ?Felt out of it x 24 hours ?  ? Codeine   ?  Doesn't work d/t metabolism problems. ?Doesn't work d/t metabolism problems. ?  ? Dairy Aid [Tilactase] Other (See Comments)  ?  Reaction:  Stomach up

## 2021-12-02 ENCOUNTER — Telehealth: Payer: Self-pay | Admitting: Neurology

## 2021-12-02 ENCOUNTER — Telehealth: Payer: Self-pay | Admitting: *Deleted

## 2021-12-02 DIAGNOSIS — G43709 Chronic migraine without aura, not intractable, without status migrainosus: Secondary | ICD-10-CM

## 2021-12-02 MED ORDER — PROPRANOLOL HCL ER 80 MG PO CP24: 80.0000 mg | ORAL_CAPSULE | Freq: Every day | ORAL | 11 refills | Status: DC

## 2021-12-02 NOTE — Telephone Encounter (Signed)
Patient is in the lobby requesting to speak with a nurse/.doctor regarding her reaction to medications, etc. She has brought several forms with her and highlighted medication interactions. She can wait 30 min or so in the lobby  ?

## 2021-12-02 NOTE — Addendum Note (Signed)
Addended by: Marcial Pacas on: 12/02/2021 05:14 PM ? ? Modules accepted: Orders ? ?

## 2021-12-02 NOTE — Telephone Encounter (Signed)
PA for Ubrelvy '50mg'$  started on covermymeds (key: [id number]). Pharmacy coverage with [employer] [phone]). Case [id number] approved through 12/02/2022. ?

## 2021-12-02 NOTE — Telephone Encounter (Addendum)
It is Ok to stop Nortriptline, use inderal LA '80mg'$  qhs as migraine prevention. ? ?Meds ordered this encounter  ?Medications  ? propranolol ER (INDERAL LA) 80 MG 24 hr capsule  ?  Sig: Take 1 capsule (80 mg total) by mouth daily.  ?  Dispense:  30 capsule  ?  Refill:  11  ?   ?Orders Placed This Encounter  ?Procedures  ? Magnesium  ? ?   ?If she has more questions, schedule a virtual visit. ?

## 2021-12-02 NOTE — Telephone Encounter (Signed)
Left message for a return call

## 2021-12-03 NOTE — Telephone Encounter (Signed)
Left message for a return call

## 2021-12-03 NOTE — Telephone Encounter (Signed)
Left second message for a return call. ?

## 2021-12-07 ENCOUNTER — Encounter: Payer: Self-pay | Admitting: *Deleted

## 2021-12-07 NOTE — Telephone Encounter (Addendum)
Left third message for patient. Left message on her husband's voicemail, asking that he have the patient call us back. Also, sent mychart message asking for a return call. ? ?Dr. Krista Blue said it is okay to stop nortriptyline. She sent in new rx for propranolol ER '80mg'$ , one capsule daily. She also placed orders for a magnesium lab to be drawn. If patient has further questions, we will need to schedule a virtual visit. ? ?Nortriptyline refills voided at pharmacy to prevent confusion. ?

## 2021-12-07 NOTE — Addendum Note (Signed)
Addended by: Noberto Retort C on: 12/07/2021 10:19 AM ? ? Modules accepted: Orders ? ?

## 2021-12-07 NOTE — Telephone Encounter (Addendum)
Left fourth message. We will mail an "unable to contact" letter. We are happy to review the plan when she calls back.  ?

## 2021-12-17 ENCOUNTER — Encounter: Payer: Self-pay | Admitting: Neurology

## 2021-12-21 NOTE — Telephone Encounter (Signed)
I spoke to the patient. She wanted to review the new treatment. Per previous notes, d/c nortriptyline, start propranolol ER '80mg'$ , one cap daily. Lab orders placed for magnesium. She is agreeable to this plan.

## 2021-12-21 NOTE — Telephone Encounter (Signed)
Separate mychart message sent requesting the patient to call our office.

## 2021-12-22 ENCOUNTER — Other Ambulatory Visit (INDEPENDENT_AMBULATORY_CARE_PROVIDER_SITE_OTHER): Payer: Self-pay

## 2021-12-22 DIAGNOSIS — G43709 Chronic migraine without aura, not intractable, without status migrainosus: Secondary | ICD-10-CM

## 2021-12-22 DIAGNOSIS — Z0289 Encounter for other administrative examinations: Secondary | ICD-10-CM

## 2021-12-23 ENCOUNTER — Encounter: Payer: Self-pay | Admitting: Neurology

## 2021-12-23 LAB — MAGNESIUM: Magnesium: 2.5 mg/dL — ABNORMAL HIGH (ref 1.6–2.3)

## 2021-12-30 ENCOUNTER — Telehealth: Payer: Self-pay | Admitting: Neurology

## 2021-12-30 NOTE — Telephone Encounter (Signed)
I went up front to speak to the patient. Denied head pain. I asked if she felt like she was experiencing vertigo symptoms and she said no. Explained she was having some involuntary movements while sitting still. Her head did not appear to be abnormally bobbing or arms shaking while I was speaking with her. However, she had captured a more pronounced event on video. She will send it through her mychart portal for Dr. Krista Blue to review prior to her appt on 01/07/22.   She also mentioned she had spoken to her allergist yesterday, made an appt with her PCP today and see cardiology tomorrow.

## 2021-12-30 NOTE — Telephone Encounter (Signed)
Pt came into office to explain she felt her head bobbing, and arms shaking and her head feels odd.

## 2022-01-01 ENCOUNTER — Encounter: Payer: Self-pay | Admitting: Cardiology

## 2022-01-01 ENCOUNTER — Ambulatory Visit: Payer: Managed Care, Other (non HMO) | Admitting: Cardiology

## 2022-01-01 VITALS — BP 128/78 | HR 61 | Temp 98.0°F | Resp 16 | Ht 66.0 in | Wt 166.0 lb

## 2022-01-01 DIAGNOSIS — R55 Syncope and collapse: Secondary | ICD-10-CM

## 2022-01-01 DIAGNOSIS — R002 Palpitations: Secondary | ICD-10-CM | POA: Insufficient documentation

## 2022-01-01 NOTE — Progress Notes (Signed)
Patient referred by Tawni Carnes, MD for chest tightness, dizziness  Subjective:   Vanessa Porter, female    DOB: 1965-09-20, 56 y.o.   MRN: [id number]   Chief Complaint  Patient presents with   Dizziness   Follow-up   Results     HPI  56 y.o. Caucasian female with mixed hyperlipidemia, family history of CAD, h/o breast cancer, fibromyalgia, frontal lobe venous malformation, GERD, dysphonia, recurrent syncope  Dizziness and lightheadedness symptoms persist, for which she is seeing Neurology. She is concerned about starting new medication propranolol for possible migraine., as recommended by Neurology. She has an upcoming air/road trip to [place], and rest of the [place].   Initial consultation visit 08/2021: Patient has undergone multiple recent work-ups regarding dizziness and vestibular dysfunction.  This is reportedly been unrevealing.  She reports lightheadedness, standing up, feels like her head is unremarkable.  Separately, she has also had work-up for chest pain with multiple prior ER visits and stress test, which have been all negative.  Chest pain is unrelated to exertion.  it usually is related to eating.     Current Outpatient Medications:    acetaminophen (TYLENOL) 650 MG CR tablet, Take by mouth., Disp: , Rfl:    albuterol (PROVENTIL HFA;VENTOLIN HFA) 108 (90 BASE) MCG/ACT inhaler, Inhale 2 puffs into the lungs every 6 (six) hours as needed for wheezing or shortness of breath. , Disp: , Rfl:    butalbital-acetaminophen-caffeine (FIORICET) 50-325-40 MG tablet, Take 1 tablet by mouth every 6 (six) hours as needed for headache. Take 1 tab at onset of migraine.  May repeat in 2 hrs, if needed.  Max dose: 2 tabs/day. This is a 30 day prescription., Disp: 10 tablet, Rfl: 3   CALCIUM PO, Take 900 mg by mouth daily., Disp: , Rfl:    diphenhydrAMINE (SOMINEX) 25 MG tablet, Take by mouth., Disp: , Rfl:    EPINEPHrine 0.3 mg/0.3 mL IJ SOAJ injection, , Disp: , Rfl:     estradiol (ESTRACE) 0.1 MG/GM vaginal cream, PLEASE SEE ATTACHED FOR DETAILED DIRECTIONS, Disp: , Rfl:    Famotidine (PEPCID PO), Take 40 mg by mouth in the morning and at bedtime., Disp: , Rfl:    fexofenadine (ALLEGRA) 180 MG tablet, Take by mouth., Disp: , Rfl:    ondansetron (ZOFRAN-ODT) 4 MG disintegrating tablet, Take 1 tablet (4 mg total) by mouth every 8 (eight) hours as needed for nausea or vomiting., Disp: 20 tablet, Rfl: 3   polyethylene glycol powder (GLYCOLAX/MIRALAX) powder, Take 17 g by mouth 2 (two) times daily., Disp: , Rfl:    Probiotic Product (PROBIOTIC PO), Take 1 tablet by mouth daily., Disp: , Rfl:    propranolol ER (INDERAL LA) 80 MG 24 hr capsule, Take 1 capsule (80 mg total) by mouth daily., Disp: 30 capsule, Rfl: 11   Ubrogepant (UBRELVY) 50 MG TABS, Take 1 tab at onset of migraine.  May repeat in 2 hrs, if needed.  Max dose: 2 tabs/day. This is a 30 day prescription., Disp: 10 tablet, Rfl: 11   VITAMIN D PO, Take 5,000 Units by mouth., Disp: , Rfl:   Cardiovascular and other pertinent studies:  EKG 01/01/2022: Sinus rhythm 58 bpm RSR(V1) -nondiagnostic    Mobile cardiac telemetry 14 days 09/03/2021 - 09/17/2021: Dominant rhythm: Sinus. HR 40-140 bpm. Avg HR 74 bpm. 29 episodes of SVT, fastest at 133 bpm for 6 beats, longest for 18 beats at 108 bpm. <1% isolated SVE, couplet/triplets. 0 episodes of VT. <1%  isolated VE, couplets. No atrial fibrillation/atrial flutter/VT/high grade AV block, sinus pause >3sec noted. 33 patient triggered events, most correlated with sinus rhythm/sinus tachcyardia without any arrhythmia Occasional episodes correlated with SVE/VE. SVT episodes did not correlate with reported symptoms.   Mobile cardiac telemetry 14 days 08/12/2021 - 08/26/2021: Dominant rhythm:Sinus. HR 41-143 bpm. Avg HR 76 bpm. 37 episodes of probable atrial tachycardia, fastest at 141 bpm for 4 beats, longest for 19 beats at 111 bpm. True duration of Supraventricular  Tachycardia difficult to ascertain due to artifact.  <1% isolated SVE, couplet/triplets. 0 episodes of VT <1% isolated VE, couplets. No triplets. No atrial fibrillation/atrial flutter/VT/high grade AV block, sinus pause >3sec noted. 11 patient triggered events, a few correlated with SVE.  EKG 08/12/2021: Sinus  Rhythm  RSR(V1) -nondiagnostic.  Nonspecific T-abnormality.   CT cardiac scoring 09/01/2021: Coronary calcium score of 0.   Recent labs: 07/2021: Glucose 92, BUN/Cr 15/0.72. EGFR >90. Na/K 141/3.9. Rest of the CMP normal H/H 14/42. MCV 86. Platelets 212  04/23/2021: Glucose 103, BUN/Cr 22/1.0. EGFR >60. Na/K 137/4.4. Rest of the CMP normal H/H 14/43. MCV 88. Platelets 268 HbA1C 5.8% Chol 216, TG 52, HDL 64, LDL 142 TSH 2.3 normal ANA +ve   Review of Systems  Cardiovascular:  Negative for chest pain, dyspnea on exertion, leg swelling, palpitations and syncope.  Neurological:  Positive for dizziness and light-headedness.        Vitals:   01/01/22 1432  BP: 128/78  Pulse: 61  Resp: 16  Temp: 98 F (36.7 C)  SpO2: 98%    No data found.  Orthostatic VS for the past 72 hrs (Last 3 readings):  Orthostatic BP Patient Position BP Location Cuff Size Orthostatic Pulse  01/01/22 1457 117/68 Standing Left Arm Normal 67  01/01/22 1456 126/74 Sitting Left Arm Normal 61  01/01/22 1455 115/68 Supine Left Arm Normal 58     There is no height or weight on file to calculate BMI. There were no vitals filed for this visit.    Objective:   Physical Exam Vitals and nursing note reviewed.  Constitutional:      General: She is not in acute distress. Neck:     Vascular: No JVD.  Cardiovascular:     Rate and Rhythm: Normal rate and regular rhythm.     Pulses: Normal pulses.     Heart sounds: Normal heart sounds. No murmur heard. Pulmonary:     Effort: Pulmonary effort is normal.     Breath sounds: Normal breath sounds. No wheezing or rales.  Musculoskeletal:      Right lower leg: No edema.     Left lower leg: No edema.       Assessment & Recommendations:   56 y.o. Caucasian female with mixed hyperlipidemia, family history of CAD, h/o breast cancer, fibromyalgia, frontal lobe venous malformation, GERD, dysphonia, recurrent syncope  Dizziness, lightheadedness: Persists even without orthostatic hypotension. Continue f/u w/Neurology, consider seeing ENT as well.   Okay to wait to start propranolol after the upcoming trip.  PSVT/atrial tachycardia: Short episodes, no correlated symptoms. Discussed vagal maneuvers.  No medications necessary at this time.  Chest pain: Atypical.  Calcium score 0.  F/u as needed   Nigel Mormon, MD Pager: [phone] Office: [phone]

## 2022-01-05 ENCOUNTER — Telehealth (INDEPENDENT_AMBULATORY_CARE_PROVIDER_SITE_OTHER): Payer: Managed Care, Other (non HMO) | Admitting: Neurology

## 2022-01-05 DIAGNOSIS — R9389 Abnormal findings on diagnostic imaging of other specified body structures: Secondary | ICD-10-CM | POA: Diagnosis not present

## 2022-01-05 DIAGNOSIS — G43709 Chronic migraine without aura, not intractable, without status migrainosus: Secondary | ICD-10-CM | POA: Diagnosis not present

## 2022-01-05 NOTE — Patient Instructions (Signed)
Fioricet as needed works well for her headaches, you should try to avoid taking too much of her Fioricet right medication rebound headache, better to be less than (or equal to) twice a week, maximum 2 tablets in 1 day   2.  Aleve as needed for mild to moderate headaches 3.  If needed may combine with Excedrin migraine, take nap when you do get headaches and treat headache early.

## 2022-01-05 NOTE — Progress Notes (Signed)
No chief complaint on file.     ASSESSMENT AND PLAN  Vanessa Porter is a 56 y.o. female   Abnormal MRI of the brain  Repeat MRI of the brain with and without contrast in February 2023, large right frontal venous anomaly, scattered supratentorium T2/FLAIR hyperdensity lesion, no contrast-enhancement, no change compared to previous scan in September 2021, spinal fluid testing in September 2022 showed no significant abnormality, 0 oligoclonal banding,  Above findings most consistent with small vessel disease, commonly associated with chronic migraine headaches, does not support a diagnosis of relapsing remitting multiple sclerosis   Chronic migraine headaches  Will try low-dose nortriptyline 10 mg every night titrating to 20 mg every night as preventive medications, I also called in Inderal LA 80 mg daily for headache prevention  Patient does not want to try triptan worried about the side effect,  Fioricet as needed was helpful  Ubrelvy 50 mg as needed for rescue therapy   DIAGNOSTIC DATA (LABS, IMAGING, TESTING) - I reviewed patient records, labs, notes, testing and imaging myself where available.   MEDICAL HISTORY:  Vanessa Porter is a 56 year old female, seen in request by her primary care physician Dr. Earnestine Mealing for evaluation of intermittent arm pain, disorientation, headaches, tightness in chest, she was seen by Dr. Jannifer Franklin in the past, last visit was in September 2022  I reviewed and summarized the referring note.  Hypothyroidism, on supplement  Today she has constellation of complaints, on multiple over-the-counter supplement, reported multiple previous history of head trauma, concussion,  Most recent incident was in October 2020, lid at the trunk of the car fell, hit the back of her head, she had frequent dizziness since then, went through rehabilitation, with some improvement,  She also developed COVID in February 2022, with recurrent dizziness, even disorientation at  the height of COVID symptoms, now has improved, also complains of blurry vision,  Reported cardiology evaluation, wearing cardiac monitoring  She continues to be active, exercise regularly, but seems to have less stamina, sometimes felt chest pressure, breathing,  Today she is also very concerned about her frequent headaches, bilateral frontal (extending to occipital upper nuchal region, has to lie down resting for a while, tried over-the-counter medication Tylenol and ibuprofen does help, sometimes more severe headache will be localized to the lateralized retro-orbital region with light, noise sensitivity   Personally reviewed MRI of brain with and without contrast in September 2021, numerous hyperintensity T2 signal within the supratentorium white matter, suggestive of demyelinating disease, 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 with adjacent gliosis MRI of cervical spine was unremarkable  MRI lumbar spine September 2022, showed no significant abnormality  Spinal fluid testing September 2022: WBC 1, glucose 59, total protein of 35, 0 oligoclonal banding,  Laboratory evaluation showed normal homocystine level of 10.4, normal CBC, CMP, Lyme titer, angiotensin-converting enzyme, protein electrophoresis, copper, positive ANA, vitamin D 42, normal TSH, V42 595, folic acid, G3O 5.8  UPDATE Dec 01 2021: Patient continue complains of frequent headaches, she brings her headache category, tends to cluster 6 to 7 days each month, doing the headache, she complains of vertex region pressure headaches, nausea, noise sensitivity, movement make her headache worse, headache last hours to days,  She also complains of frequent dizziness sensation, even walking around the grocery isles, she felt lightheaded, motion sickness sensation afterwards, sometimes triggered a headache, she felt scalp skin sensitivity, tender to touch, oftentimes her headache also extending to her  neck,  Because of her  headache has typical migraine features, also reported a history of migraine in her college years, then was lateralized severe pounding headache with light noise sensitivity, she was given low-dose Imitrex 25 mg as needed, she does not feel comfortable taking triptan due to her abnormal MRI brain findings, never tried it, Roselyn Meier was not approved by her insurance company  She also wearing bilateral ankle brace for recent diagnosis of tendinitis, history of planta fasciitis, heel spur, difficulty bearing weight  We personally reviewed MRI of the brain with without contrast in February 2023, compared to previous MRI in September 2021, 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, unchanged, right frontal subcortical T2/FLAIR hyperintensity lesion, scattered smaller subcortical hypodensity signal, no contrast-enhancement, no change compared to previous scan in 2021  Laboratory evaluation in February 2023 from [facility], normal CBC hemoglobin of 14.0, normal liver function laboratory anticardiolipin antibody, normal TSH 2.9, free T48.9, creatinine 0.81, ESR was within normal limits 24, CPK was normal at 54, C-reactive protein less than 1, low normal B12 271, she is on supplement, vitamin D level was normal at 42  X-ray of bilateral hands and feet showed no definite radiographic evidence of erosive arthropathy, minimal degenerative change in the first right IP joint,  X-ray of SI joints showed very early degenerative changes in the lower SI joints  X-ray of lumbar and thoracic showed mild degenerative changes   X-ray of cervical showed reversal of cervical lordosis, degenerative disc disease   Virtual Visit via video UPDATE June 6th 2023 I discussed the limitations of evaluation and management by telemedicine and the availability of in person appointments. The patient expressed understanding and agreed to proceed  Location: Provider: [place] office; Patient: Parked  vehicle  I connected with Vanessa Porter  on June 6th 2023 by a video enabled telemedicine application and verified that I am speaking with the correct person using two identifiers.  Since last visit vertigo, she had a new pair of glasses, which seems to help her,  She continued complaints of intermittent headache, tried Fioriect 6 tabs in past 4 week, which is helpful, she also see acupuncturist, which was also helpful  She continued complaints of transient dizziness, often followed by brief pressure sensation after walking through bright aisles in certain grocery store, triggered by bright light, quick movement, she is very hesitant to start on any medications given low-dose Inderal, nortriptyline, worried about the side effect,  She also reported seen by cardiologist recently, with normal blood pressure, and EKG, mild elevation of magnesium level 2.5,  Labs also showed normal B12, ferritin, CBC, CMP, TSH  Observations/Objective: I have reviewed problem lists, medications, allergies. Awake, alert, oriented to history taking and casual conversation, facial symmetric, no dysarthria, no aphasia, moving 4 extremities without difficulties     REVIEW OF SYSTEMS:  Full 14 system review of systems performed and notable only for as above All other review of systems were negative.   ALLERGIES: Allergies  Allergen Reactions   Aspirin Anaphylaxis   Compazine Anaphylaxis   Fructose Anaphylaxis   Lidocaine     Other reaction(s): Angioedema   Midazolam Hcl Anaphylaxis   Omnipaque [Iohexol] Anaphylaxis    Per pt required epi pen   Other Anaphylaxis    Numbing medications. Does not remember name of. Pt tolerated Tetracaine 04/14/21 for LP.   Permethrin Shortness Of Breath   Sulfa Antibiotics Hives    Other reaction(s): Unknown   Xylitol Swelling    Swelling of arms, face    Amino Acids  Other (See Comments)    Pt is avoiding these in her diet.    Bupivacaine     Other reaction(s): Other  (See Comments), Other (See Comments) Felt out of it x 24 hours Felt out of it x 24 hours    Codeine     Doesn't work d/t metabolism problems. Doesn't work d/t metabolism problems.    Dairy Aid [Tilactase] Other (See Comments)    Reaction:  Stomach upset    Doxycycline Other (See Comments)    Reaction:  Vision issues    Hyaluronic Acid [Sodium Hyaluronate]     Gel- Injections    Indomethacin Other (See Comments)    Reaction:  Headaches that caused white matter in the brain to change.    Nickel     Other reaction(s): Other (See Comments) Green wrist after wearing a bracelet, pussy ears with gold and rash. Inflammation around gold tooth around her gum line.   Pneumococcal Vaccines Swelling   Feldene [Piroxicam] Rash   Latex Rash   Milk [Lac Bovis] Rash    HOME MEDICATIONS: Current Outpatient Medications  Medication Sig Dispense Refill   acetaminophen (TYLENOL) 650 MG CR tablet Take by mouth.     albuterol (PROVENTIL HFA;VENTOLIN HFA) 108 (90 BASE) MCG/ACT inhaler Inhale 2 puffs into the lungs every 6 (six) hours as needed for wheezing or shortness of breath.      butalbital-acetaminophen-caffeine (FIORICET) 50-325-40 MG tablet Take 1 tablet by mouth every 6 (six) hours as needed for headache. Take 1 tab at onset of migraine.  May repeat in 2 hrs, if needed.  Max dose: 2 tabs/day. This is a 30 day prescription. 10 tablet 3   CALCIUM PO Take 900 mg by mouth daily.     diphenhydrAMINE (SOMINEX) 25 MG tablet Take by mouth.     EPINEPHrine 0.3 mg/0.3 mL IJ SOAJ injection      estradiol (ESTRACE) 0.1 MG/GM vaginal cream PLEASE SEE ATTACHED FOR DETAILED DIRECTIONS     Famotidine (PEPCID PO) Take 40 mg by mouth in the morning and at bedtime.     fexofenadine (ALLEGRA) 180 MG tablet Take by mouth.     ondansetron (ZOFRAN-ODT) 4 MG disintegrating tablet Take 1 tablet (4 mg total) by mouth every 8 (eight) hours as needed for nausea or vomiting. 20 tablet 3   polyethylene glycol powder  (GLYCOLAX/MIRALAX) powder Take 17 g by mouth 2 (two) times daily.     Probiotic Product (PROBIOTIC PO) Take 1 tablet by mouth daily.     propranolol ER (INDERAL LA) 80 MG 24 hr capsule Take 1 capsule (80 mg total) by mouth daily. 30 capsule 11   Ubrogepant (UBRELVY) 50 MG TABS Take 1 tab at onset of migraine.  May repeat in 2 hrs, if needed.  Max dose: 2 tabs/day. This is a 30 day prescription. 10 tablet 11   VITAMIN D PO Take 5,000 Units by mouth.     No current facility-administered medications for this visit.    PAST MEDICAL HISTORY: Past Medical History:  Diagnosis Date   Arthritis    Breast cancer ([place])    Fibromyalgia    Fructose malabsorption    Hypermobile joints    Hypothyroid    Lymphedema    MTHFR (methylene THF reductase) deficiency and homocystinuria (HCC)    Small intestinal bacterial overgrowth (SIBO)    Venous malformation     PAST SURGICAL HISTORY: Past Surgical History:  Procedure Laterality Date   CYSTOCELE REPAIR  KNEE SURGERY     RECTOCELE REPAIR     TONSILLECTOMY     vaginal sling      FAMILY HISTORY: Family History  Problem Relation Age of Onset   Hypertension Mother    Heart failure Father    CAD Father    Atrial fibrillation Father    Hypertension Sister    Stroke Sister    Cancer Sister     SOCIAL HISTORY: Social History   Socioeconomic History   Marital status: Married    Spouse name: Not on file   Number of children: 3   Years of education: Not on file   Highest education level: Not on file  Occupational History   Occupation: retired  Tobacco Use   Smoking status: Never   Smokeless tobacco: Never  Vaping Use   Vaping Use: Never used  Substance and Sexual Activity   Alcohol use: No   Drug use: No   Sexual activity: Never    Birth control/protection: None  Other Topics Concern   Not on file  Social History Narrative   Lives husband and daughter   Temporily staying with mom and dad who are assisting to take care of  her, she can't stay at home by herself, daughter and husband work to much to take care of her   Right handed   Drinks no caffeine daily   Social Determinants of Health   Financial Resource Strain: Not on file  Food Insecurity: Not on file  Transportation Needs: Not on file  Physical Activity: Not on file  Stress: Not on file  Social Connections: Not on file  Intimate Partner Violence: Not on file    Total time spent reviewing the chart, obtaining history, examined patient, ordering tests, documentation, consultations and family, care coordination was 32  minues     Marcial Pacas, M.D. Ph.D.  [facility] [address] [place], [place] [address] Ph: [phone] Fax: [phone]  CC:  [place] [address] [place],  [place] [address]  Tawni Carnes, MD

## 2022-01-25 ENCOUNTER — Telehealth: Payer: Self-pay | Admitting: Neurology

## 2022-01-25 NOTE — Telephone Encounter (Signed)
LVM and mychart msg informing pt of appointment change - MD changing schedule template

## 2022-02-15 ENCOUNTER — Ambulatory Visit: Payer: Managed Care, Other (non HMO) | Admitting: Family Medicine

## 2022-03-15 ENCOUNTER — Ambulatory Visit: Payer: Managed Care, Other (non HMO) | Admitting: Cardiology

## 2022-04-10 ENCOUNTER — Encounter: Payer: Self-pay | Admitting: Cardiology

## 2022-04-12 ENCOUNTER — Encounter: Payer: Self-pay | Admitting: Neurology

## 2022-04-12 ENCOUNTER — Ambulatory Visit: Payer: Managed Care, Other (non HMO) | Admitting: Neurology

## 2022-04-12 VITALS — BP 118/80 | HR 76 | Ht 66.0 in | Wt 170.0 lb

## 2022-04-12 DIAGNOSIS — G43709 Chronic migraine without aura, not intractable, without status migrainosus: Secondary | ICD-10-CM

## 2022-04-12 DIAGNOSIS — R9389 Abnormal findings on diagnostic imaging of other specified body structures: Secondary | ICD-10-CM

## 2022-04-12 DIAGNOSIS — M542 Cervicalgia: Secondary | ICD-10-CM | POA: Diagnosis not present

## 2022-04-12 NOTE — Progress Notes (Signed)
Chief Complaint  Patient presents with   Consult    Toe numbness       ASSESSMENT AND PLAN  Vanessa Porter is a 56 y.o. female Acute onset of vertigo with head pressure  Differentiation diagnosis including benign positional vertigo versus migraine variant  She is under the care of ENT, vestibular test pending in October  She prefer not to take preventive medications, Fioricet as needed was helpful for headache  Worsening neck strain, cracking sound while turning,  Brisk reflex on examinations,  MRI of cervical spine to rule out cervical degenerative disease    Abnormal MRI of the brain  Repeat MRI of the brain with and without contrast in February 2023, large right frontal venous anomaly, scattered supratentorium T2/FLAIR hyperdensity lesion, no contrast-enhancement, no change compared to previous scan in September 2021, spinal fluid testing in September 2022 showed no significant abnormality, 0 oligoclonal banding,  Repeat MRI of the brain without contrast at [facility] April 09, 2022 described band of non-mass-like signal abnormality of the subcortical white matter of the right frontal lobe, radiology reads the possibility of venous ischemia related to the underlying developmental venous anomaly, recommended, contrast study, discussed with patient, recent contrast study was in February 2023, the above description is similar to previous findings, will consider repeat MRI of the brain with without contrast in 1 year    Chronic migraine headaches  She was on propanolol as preventive medication, stopped all the prescription medicine following her appendectomy,   Reported her headache is overall under good control,  Fioricet as needed,  Does not feel comfortable taking triptan or Ubrelvy  DIAGNOSTIC DATA (LABS, IMAGING, TESTING) - I reviewed patient records, labs, notes, testing and imaging myself where available.   MEDICAL HISTORY:  Vanessa Porter is a 56 year old female,  seen in request by her primary care physician Dr. Earnestine Mealing for evaluation of intermittent arm pain, disorientation, headaches, tightness in chest, she was seen by Dr. Jannifer Franklin in the past, last visit was in September 2022  I reviewed and summarized the referring note.  Hypothyroidism, on supplement  Today she has constellation of complaints, on multiple over-the-counter supplement, reported multiple previous history of head trauma, concussion,  Most recent incident was in October 2020, lid at the trunk of the car fell, hit the back of her head, she had frequent dizziness since then, went through rehabilitation, with some improvement,  She also developed COVID in February 2022, with recurrent dizziness, even disorientation at the height of COVID symptoms, now has improved, also complains of blurry vision,  Reported cardiology evaluation, wearing cardiac monitoring  She continues to be active, exercise regularly, but seems to have less stamina, sometimes felt chest pressure, breathing,  Today she is also very concerned about her frequent headaches, bilateral frontal (extending to occipital upper nuchal region, has to lie down resting for a while, tried over-the-counter medication Tylenol and ibuprofen does help, sometimes more severe headache will be localized to the lateralized retro-orbital region with light, noise sensitivity   Personally reviewed MRI of brain with and without contrast in September 2021, numerous hyperintensity T2 signal within the supratentorium white matter, suggestive of demyelinating disease, 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 with adjacent gliosis MRI of cervical spine was unremarkable  MRI lumbar spine September 2022, showed no significant abnormality  Spinal fluid testing September 2022: WBC 1, glucose 59, total protein of 35, 0 oligoclonal banding,  Laboratory evaluation showed normal homocystine level of 10.4, normal CBC,  CMP, Lyme titer, angiotensin-converting enzyme, protein electrophoresis, copper, positive ANA, vitamin D 42, normal TSH, Z61 096, folic acid, E4V 5.8  UPDATE Dec 01 2021: Patient continue complains of frequent headaches, she brings her headache category, tends to cluster 6 to 7 days each month, during the headache, she complains of vertex region pressure headaches, nausea, noise sensitivity, movement make her headache worse, headache last hours to days,  She also complains of frequent dizziness sensation, even walking around the grocery isles, she felt lightheaded, motion sickness sensation afterwards, sometimes triggered a headache, she felt scalp skin sensitivity, tender to touch, oftentimes her headache also extending to her neck,  Because of her headache has typical migraine features, also reported a history of migraine in her college years, then was lateralized severe pounding headache with light noise sensitivity, she was given low-dose Imitrex 25 mg as needed, she does not feel comfortable taking triptan due to her abnormal MRI brain findings, never tried it, Roselyn Meier was not approved by her insurance company  She also wearing bilateral ankle brace for recent diagnosis of tendinitis, history of planta fasciitis, heel spur, difficulty bearing weight  We personally reviewed MRI of the brain with without contrast in February 2023, compared to previous MRI in September 2021, 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, unchanged, right frontal subcortical T2/FLAIR hyperintensity lesion, scattered smaller subcortical hypodensity signal, no contrast-enhancement, no change compared to previous scan in 2021  Laboratory evaluation in February 2023 from [facility], normal CBC hemoglobin of 14.0, normal liver function laboratory anticardiolipin antibody, normal TSH 2.9, free T48.9, creatinine 0.81, ESR was within normal limits 24, CPK was normal at 54, C-reactive protein less than 1, low normal B12  271, she is on supplement, vitamin D level was normal at 42  X-ray of bilateral hands and feet showed no definite radiographic evidence of erosive arthropathy, minimal degenerative change in the first right IP joint,  X-ray of SI joints showed very early degenerative changes in the lower SI joints  X-ray of lumbar and thoracic showed mild degenerative changes   X-ray of cervical showed reversal of cervical lordosis, degenerative disc disease   Virtual Visit via video UPDATE June 6th 2023 Since last visit vertigo, she had a new pair of glasses, which seems to help her,  She continued complaints of intermittent headache, tried Fioriect 6 tabs in past 4 week, which is helpful, she also see acupuncturist, which was also helpful  She continued complaints of transient dizziness, often followed by brief pressure sensation after walking through bright aisles in certain grocery store, triggered by bright light, quick movement, she is very hesitant to start on any medications given low-dose Inderal, nortriptyline, worried about the side effect,  She also reported seen by cardiologist recently, with normal blood pressure, and EKG, mild elevation of magnesium level 2.5,  Labs also showed normal B12, ferritin, CBC, CMP, TSH  UPDATE Sept 11 2023: I reviewed emergency room evaluation at [facility] April 09, 2022, consistent vertigo, room spinning, provoked by turning head, neck pain after swimming recently,  MRI of the brain no acute abnormality, bandlike non-mass-like signal at the subcortical white matter of the right frontal lobe, suspicious for venous ischemia related to the underlying developmental venous anomaly, suggested return for MRI contrast, MRA showed no acute intracranial abnormality,  ENT  evaluation April 08, 2022, Epley's maneuver was performed at the clinic, 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, mild bilateral sensorineural hearing loss, bilateral tinnitus, more comprehensive ENT vestibular  evaluation pending  She describes sudden  onset of vertigo, lasting for few minutes, but since then, with sudden positional change, she complains of discomfort, also uncomfortable sensation in her brain, as if in the water denies significant headaches, Fioricet as needed was helpful,  Also complains of increased neck pain, neck tension  Personally reviewed MRI of cervical spine in October 2021, no significant abnormalities     PHYSICAL EXAMNIATION:  Vitals:   04/12/22 0843  Weight: 170 lb (77.1 kg)  Height: 5' 6"  (1.676 m)    Gen: NAD, conversant, well nourised, well groomed                     Cardiovascular: Regular rate rhythm, no peripheral edema, warm, nontender. Eyes: Conjunctivae clear without exudates or hemorrhage Neck: Supple, no carotid bruits. Pulmonary: Clear to auscultation bilaterally   NEUROLOGICAL EXAM:  MENTAL STATUS: Speech/cognition: Awake, alert oriented to history taking and casual conversation  CRANIAL NERVES: CN II: Visual fields are full to confrontation.  Pupils are round equal and briskly reactive to light.  Funduscopy showed normal optic disc bilaterally  CN III, IV, VI: extraocular movement are normal. No ptosis. CN V: Facial sensation is intact to pinprick in all 3 divisions bilaterally. Corneal responses are intact.  CN VII: Face is symmetric with normal eye closure and smile. CN VIII: Intact bilateral tympanic membranes CN IX, X: Palate elevates symmetrically. Phonation is normal. CN XI: Head turning and shoulder shrug are intact  MOTOR: There is no pronator drift of out-stretched arms. Muscle bulk and tone are normal. Muscle strength is normal.  REFLEXES: Reflexes are 2+ and symmetric at the biceps, triceps, knees, and ankles. Plantar responses are flexor.  SENSORY: Intact to light touch, pinprick, positional and vibratory sensation are intact in fingers and toes.  COORDINATION: Rapid alternating movements and fine finger movements are  intact. There is no dysmetria on finger-to-nose and heel-knee-shin.    GAIT/STANCE: Posture is normal. Gait is steady with normal steps,   REVIEW OF SYSTEMS:  Full 14 system review of systems performed and notable only for as above All other review of systems were negative.   ALLERGIES: Allergies  Allergen Reactions   Aspirin Anaphylaxis   Compazine Anaphylaxis   Fructose Anaphylaxis   Lidocaine     Other reaction(s): Angioedema   Midazolam Hcl Anaphylaxis   Omnipaque [Iohexol] Anaphylaxis    Per pt required epi pen   Other Anaphylaxis    Numbing medications. Does not remember name of. Pt tolerated Tetracaine 04/14/21 for LP.   Permethrin Shortness Of Breath   Sulfa Antibiotics Hives    Other reaction(s): Unknown   Xylitol Swelling    Swelling of arms, face    Amino Acids Other (See Comments)    Pt is avoiding these in her diet.    Bupivacaine     Other reaction(s): Other (See Comments), Other (See Comments) Felt out of it x 24 hours Felt out of it x 24 hours    Codeine     Doesn't work d/t metabolism problems. Doesn't work d/t metabolism problems.    Dairy Aid [Tilactase] Other (See Comments)    Reaction:  Stomach upset    Doxycycline Other (See Comments)    Reaction:  Vision issues    Hyaluronic Acid [Sodium Hyaluronate]     Gel- Injections    Indomethacin Other (See Comments)    Reaction:  Headaches that caused white matter in the brain to change.    Nickel     Other reaction(s): Other (See  Comments) Green wrist after wearing a bracelet, pussy ears with gold and rash. Inflammation around gold tooth around her gum line.   Pneumococcal Vaccines Swelling   Feldene [Piroxicam] Rash   Latex Rash   Milk [Milk (Cow)] Rash    HOME MEDICATIONS: Current Outpatient Medications  Medication Sig Dispense Refill   acetaminophen (TYLENOL) 650 MG CR tablet Take by mouth.     albuterol (PROVENTIL HFA;VENTOLIN HFA) 108 (90 BASE) MCG/ACT inhaler Inhale 2 puffs into the  lungs every 6 (six) hours as needed for wheezing or shortness of breath.      butalbital-acetaminophen-caffeine (FIORICET) 50-325-40 MG tablet Take 1 tablet by mouth every 6 (six) hours as needed for headache. Take 1 tab at onset of migraine.  May repeat in 2 hrs, if needed.  Max dose: 2 tabs/day. This is a 30 day prescription. 10 tablet 3   CALCIUM PO Take 900 mg by mouth daily.     diphenhydrAMINE (SOMINEX) 25 MG tablet Take by mouth.     EPINEPHrine 0.3 mg/0.3 mL IJ SOAJ injection      estradiol (ESTRACE) 0.1 MG/GM vaginal cream PLEASE SEE ATTACHED FOR DETAILED DIRECTIONS     Famotidine (PEPCID PO) Take 40 mg by mouth in the morning and at bedtime.     fexofenadine (ALLEGRA) 180 MG tablet Take by mouth.     ondansetron (ZOFRAN-ODT) 4 MG disintegrating tablet Take 1 tablet (4 mg total) by mouth every 8 (eight) hours as needed for nausea or vomiting. 20 tablet 3   polyethylene glycol powder (GLYCOLAX/MIRALAX) powder Take 17 g by mouth 2 (two) times daily.     Probiotic Product (PROBIOTIC PO) Take 1 tablet by mouth daily.     propranolol ER (INDERAL LA) 80 MG 24 hr capsule Take 1 capsule (80 mg total) by mouth daily. 30 capsule 11   Ubrogepant (UBRELVY) 50 MG TABS Take 1 tab at onset of migraine.  May repeat in 2 hrs, if needed.  Max dose: 2 tabs/day. This is a 30 day prescription. 10 tablet 11   VITAMIN D PO Take 5,000 Units by mouth.     No current facility-administered medications for this visit.    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)    Small intestinal bacterial overgrowth (SIBO)    Venous malformation     PAST SURGICAL HISTORY: Past Surgical History:  Procedure Laterality Date   CYSTOCELE REPAIR     KNEE SURGERY     RECTOCELE REPAIR     TONSILLECTOMY      vaginal sling      FAMILY HISTORY: Family History  Problem Relation Age of Onset   Hypertension Mother    Heart failure Father    CAD Father    Atrial fibrillation Father    Hypertension Sister    Stroke Sister    Cancer Sister     SOCIAL HISTORY: Social History   Socioeconomic History   Marital status: Married    Spouse name: Not on file   Number of children: 3   Years of education: Not on file   Highest education level: Not on file  Occupational History   Occupation: retired  Tobacco Use   Smoking status: Never   Smokeless tobacco: Never  Vaping Use   Vaping Use: Never used  Substance and Sexual Activity   Alcohol use: No   Drug use: No   Sexual activity: Never    Birth control/protection: None  Other Topics Concern   Not on file  Social History Narrative   Lives husband and daughter   Temporily staying with mom and dad who are assisting to take care of her, she can't stay at home by herself, daughter and husband work to much to take care of her   Right handed   Drinks no caffeine daily   Social Determinants of Health   Financial Resource Strain: Not on file  Food Insecurity: Not on file  Transportation Needs: Not on file  Physical Activity: Not on file  Stress: Not on file  Social Connections: Not on file  Intimate Partner Violence: Not on file    Total time spent reviewing the chart, obtaining history, examined patient, ordering tests, documentation, consultations and family, care coordination was 50  minues     Marcial Pacas, M.D. Ph.D.  [facility] [address] [place], [place] [address] Ph: [phone] Fax: [phone]  CC:  [place] [address] [place],  [place] [address]  Tawni Carnes, MD

## 2022-04-12 NOTE — Telephone Encounter (Signed)
From patient.

## 2022-04-13 ENCOUNTER — Telehealth: Payer: Self-pay | Admitting: Neurology

## 2022-04-13 NOTE — Telephone Encounter (Signed)
[employer] sent to GI they obtain auth  

## 2022-04-18 ENCOUNTER — Other Ambulatory Visit: Payer: Managed Care, Other (non HMO)

## 2022-04-19 ENCOUNTER — Ambulatory Visit: Payer: Managed Care, Other (non HMO) | Admitting: Neurology

## 2022-04-21 ENCOUNTER — Telehealth: Payer: Self-pay | Admitting: Neurology

## 2022-04-21 ENCOUNTER — Encounter: Payer: Self-pay | Admitting: Neurology

## 2022-04-21 NOTE — Telephone Encounter (Signed)
Pt is calling. Stated Vanessa Porter called her and scheduled her MRI for last Sunday @ 6 pm. Pt stated when she got to [place] she was told it was cancel because the doctors office didn't get it approved by insurance. Pt said she would like this done by Thursday because she is going out of town.

## 2022-04-21 NOTE — Telephone Encounter (Signed)
[facility] is handling the authorization. They said as of today it is getting denied: for the following: Based on [employer] Guidelines Section(s): Neck (Cervical Spine) Pain without and with Neurological Features (Including Stenosis) (SP 3.1) and 1.0 General Guidelines, we cannot approve this request. Your healthcare provider told us that there is a concern related to your neck. The request cannot be approved because:    Imaging requires six weeks of provider directed treatment to be completed. Supported treatments include (but are not limited to) drugs for swelling or pain, an in office workout (physical therapy), and/or oral or injected steroids. This must have been completed in the past three months without improved symptoms. Contact (via office visit, phone, email, or messaging) must occur after the treatment is completed. This has not been met because: You have not completed six weeks of provider directed treatment. The provider directed treatment did not occur within the last three months. Symptoms must be the same or worse after treatment to support imaging. There was no contact with your provider after completing treatment.  [phone] follow options for p2p Case #[id number]

## 2022-04-21 NOTE — Telephone Encounter (Signed)
From patient.

## 2022-04-29 ENCOUNTER — Ambulatory Visit: Payer: Managed Care, Other (non HMO) | Admitting: Neurology

## 2022-05-13 ENCOUNTER — Ambulatory Visit: Payer: Managed Care, Other (non HMO) | Admitting: Neurology

## 2022-08-12 ENCOUNTER — Encounter: Payer: Self-pay | Admitting: Neurology

## 2022-08-12 ENCOUNTER — Telehealth: Payer: Self-pay | Admitting: Neurology

## 2022-08-12 ENCOUNTER — Ambulatory Visit: Payer: Managed Care, Other (non HMO) | Admitting: Neurology

## 2022-08-12 VITALS — BP 108/78 | HR 65 | Ht 64.0 in | Wt 176.0 lb

## 2022-08-12 DIAGNOSIS — R9389 Abnormal findings on diagnostic imaging of other specified body structures: Secondary | ICD-10-CM | POA: Diagnosis not present

## 2022-08-12 DIAGNOSIS — G43709 Chronic migraine without aura, not intractable, without status migrainosus: Secondary | ICD-10-CM | POA: Diagnosis not present

## 2022-08-12 NOTE — Telephone Encounter (Signed)
Patient wants MRI done in [place] where she is already scheduled for another MRI ordered by a different provider.

## 2022-08-12 NOTE — Telephone Encounter (Signed)
I have it deferred to do in sept

## 2022-08-12 NOTE — Telephone Encounter (Signed)
Dr. Krista Blue wants the MRI done at [facility] after September 2024

## 2022-08-12 NOTE — Telephone Encounter (Signed)
I ordered MRI brain w/wo, most recent one was from [facility] in Sept 2023, please call patient, would like to wait for 12 months, so MRI brain will be done after Sept 2024.

## 2022-08-12 NOTE — Progress Notes (Signed)
Chief Complaint  Patient presents with   Follow-up    Rm 17 alone. Here for follow up with migraines and has not taken meds in over a week for lab work      ASSESSMENT AND PLAN  Vanessa Porter is a 57 y.o. female Chronic migraine headache acute onset of vertigo with head pressure  Differentiation diagnosis including benign positional vertigo versus migraine variant  She is under the care of ENT, vestibular test was reported normal   Fioricet as needed was helpful for headache   Abnormal MRI of the brain  MRI of the brain with and without contrast in February 2023, large right frontal venous anomaly, scattered supratentorium T2/FLAIR hyperdensity lesion, no contrast-enhancement, no change compared to previous scan in September 2021, spinal fluid testing in September 2022 showed no significant abnormality, 0 oligoclonal banding,  Repeat MRI of the brain without contrast at [facility] April 09, 2022 described band of non-mass-like signal abnormality of the subcortical white matter of the right frontal lobe, radiology reads the possibility of venous ischemia related to the underlying developmental venous anomaly, recommended, contrast study, discussed with patient, recent contrast study was in February 2023, the above description is similar to previous findings,   MRA of brain and neck showed no large vessel disease.  Repeat MRI brain w/wo contrast in Sept 2024.    DIAGNOSTIC DATA (LABS, IMAGING, TESTING) - I reviewed patient records, labs, notes, testing and imaging myself where available.   MEDICAL HISTORY:  Vanessa Porter is a 57 year old female, seen in request by her primary care physician Dr. Earnestine Mealing for evaluation of intermittent arm pain, disorientation, headaches, tightness in chest, she was seen by Dr. Jannifer Franklin in the past, last visit was in September 2022  I reviewed and summarized the referring note.  Hypothyroidism, on supplement  Today she has constellation  of complaints, on multiple over-the-counter supplement, reported multiple previous history of head trauma, concussion,  Most recent incident was in October 2020, lid at the trunk of the car fell, hit the back of her head, she had frequent dizziness since then, went through rehabilitation, with some improvement,  She also developed COVID in February 2022, with recurrent dizziness, even disorientation at the height of COVID symptoms, now has improved, also complains of blurry vision,  Reported cardiology evaluation, wearing cardiac monitoring  She continues to be active, exercise regularly, but seems to have less stamina, sometimes felt chest pressure, breathing,  Today she is also very concerned about her frequent headaches, bilateral frontal (extending to occipital upper nuchal region, has to lie down resting for a while, tried over-the-counter medication Tylenol and ibuprofen does help, sometimes more severe headache will be localized to the lateralized retro-orbital region with light, noise sensitivity   Personally reviewed MRI of brain with and without contrast in September 2021, numerous hyperintensity T2 signal within the supratentorium white matter, suggestive of demyelinating disease, 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 with adjacent gliosis MRI of cervical spine was unremarkable  MRI lumbar spine September 2022, showed no significant abnormality  Spinal fluid testing September 2022: WBC 1, glucose 59, total protein of 35, 0 oligoclonal banding,  Laboratory evaluation showed normal homocystine level of 10.4, normal CBC, CMP, Lyme titer, angiotensin-converting enzyme, protein electrophoresis, copper, positive ANA, vitamin D 42, normal TSH, M19 622, folic acid, W9N 5.8  UPDATE Dec 01 2021: Patient continue complains of frequent headaches, she brings her headache category, tends to cluster 6 to 7 days each month,  during the headache, she complains of  vertex region pressure headaches, nausea, noise sensitivity, movement make her headache worse, headache last hours to days,  She also complains of frequent dizziness sensation, even walking around the grocery isles, she felt lightheaded, motion sickness sensation afterwards, sometimes triggered a headache, she felt scalp skin sensitivity, tender to touch, oftentimes her headache also extending to her neck,  Because of her headache has typical migraine features, also reported a history of migraine in her college years, then was lateralized severe pounding headache with light noise sensitivity, she was given low-dose Imitrex 25 mg as needed, she does not feel comfortable taking triptan due to her abnormal MRI brain findings, never tried it, Roselyn Meier was not approved by her insurance company  She also wearing bilateral ankle brace for recent diagnosis of tendinitis, history of planta fasciitis, heel spur, difficulty bearing weight  We personally reviewed MRI of the brain with without contrast in February 2023, compared to previous MRI in September 2021, 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, unchanged, right frontal subcortical T2/FLAIR hyperintensity lesion, scattered smaller subcortical hypodensity signal, no contrast-enhancement, no change compared to previous scan in 2021  Laboratory evaluation in February 2023 from [facility], normal CBC hemoglobin of 14.0, normal liver function laboratory anticardiolipin antibody, normal TSH 2.9, free T48.9, creatinine 0.81, ESR was within normal limits 24, CPK was normal at 54, C-reactive protein less than 1, low normal B12 271, she is on supplement, vitamin D level was normal at 42  X-ray of bilateral hands and feet showed no definite radiographic evidence of erosive arthropathy, minimal degenerative change in the first right IP joint,  X-ray of SI joints showed very early degenerative changes in the lower SI joints  X-ray of lumbar and thoracic  showed mild degenerative changes   X-ray of cervical showed reversal of cervical lordosis, degenerative disc disease   Virtual Visit via video UPDATE June 6th 2023 Since last visit vertigo, she had a new pair of glasses, which seems to help her,  She continued complaints of intermittent headache, tried Fioriect 6 tabs in past 4 week, which is helpful, she also see acupuncturist, which was also helpful  She continued complaints of transient dizziness, often followed by brief pressure sensation after walking through bright aisles in certain grocery store, triggered by bright light, quick movement, she is very hesitant to start on any medications given low-dose Inderal, nortriptyline, worried about the side effect,  She also reported seen by cardiologist recently, with normal blood pressure, and EKG, mild elevation of magnesium level 2.5,  Labs also showed normal B12, ferritin, CBC, CMP, TSH  UPDATE Sept 11 2023: I reviewed emergency room evaluation at [facility] April 09, 2022, consistent vertigo, room spinning, provoked by turning head, neck pain after swimming recently,  MRI of the brain no acute abnormality, bandlike non-mass-like signal at the subcortical white matter of the right frontal lobe, suspicious for venous ischemia related to the underlying developmental venous anomaly, suggested return for MRI contrast, MRA showed no acute intracranial abnormality,  ENT  evaluation April 08, 2022, Epley's maneuver was performed at the clinic, 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, mild bilateral sensorineural hearing loss, bilateral tinnitus, more comprehensive ENT vestibular evaluation pending  She describes sudden onset of vertigo, lasting for few minutes, but since then, with sudden positional change, she complains of discomfort, also uncomfortable sensation in her brain, as if in the water denies significant headaches, Fioricet as needed was helpful,  Also complains of increased neck pain, neck  tension  Personally reviewed MRI of cervical spine in October 2021, no significant abnormalities  UPDATE Aug 12 2022: She still has intermittent headache, do not have to use Fioricet frequently, she had a prolonged headache around Thanksgiving time, hosting family dinner, then began to develop headache with associated dizziness, has to lie down, headache last for 2 weeks,  Also had an episode of sudden onset dizziness with sudden positional change, as if the all the blood rushing down her body, went to urgent care  Reported normal ENT vestibular workup, I do not find the formal report,   Vascular evaluation on Aug 10 2022 for abdominal distention, and symptomatic varicose, pending MRI of pelvic, EMG/NCS by [facility] was reported normal in October 2023.   PHYSICAL EXAMNIATION:  Vitals:   08/12/22 1005  Weight: 176 lb (79.8 kg)  Height: '5\' 4"'$  (1.626 m)    Gen: NAD, conversant, well nourised, well groomed                     Cardiovascular: Regular rate rhythm, no peripheral edema, warm, nontender. Eyes: Conjunctivae clear without exudates or hemorrhage Neck: Supple, no carotid bruits. Pulmonary: Clear to auscultation bilaterally   NEUROLOGICAL EXAM:  MENTAL STATUS: Speech/cognition: Awake, alert oriented to history taking and casual conversation  CRANIAL NERVES: CN II: Visual fields are full to confrontation.  Pupils are round equal and briskly reactive to light.  Funduscopy showed normal optic disc bilaterally  CN III, IV, VI: extraocular movement are normal. No ptosis. CN V: Facial sensation is intact to pinprick in all 3 divisions bilaterally. Corneal responses are intact.  CN VII: Face is symmetric with normal eye closure and smile. CN VIII: Intact bilateral tympanic membranes CN IX, X: Palate elevates symmetrically. Phonation is normal. CN XI: Head turning and shoulder shrug are intact  MOTOR: There is no pronator drift of out-stretched arms. Muscle bulk and tone are normal.  Muscle strength is normal.  REFLEXES: Reflexes are 2+ and symmetric at the biceps, triceps, knees, and ankles. Plantar responses are flexor.  SENSORY: Intact to light touch, pinprick, positional and vibratory sensation are intact in fingers and toes.  COORDINATION: Rapid alternating movements and fine finger movements are intact. There is no dysmetria on finger-to-nose and heel-knee-shin.    GAIT/STANCE: Posture is normal. Gait is steady with normal steps,   REVIEW OF SYSTEMS:  Full 14 system review of systems performed and notable only for as above All other review of systems were negative.   ALLERGIES: Allergies  Allergen Reactions   Aspirin Anaphylaxis   Compazine Anaphylaxis   Fructose Anaphylaxis   Lidocaine     Other reaction(s): Angioedema   Midazolam Hcl Anaphylaxis   Omnipaque [Iohexol] Anaphylaxis    Per pt required epi pen   Other Anaphylaxis    Numbing medications. Does not remember name of. Pt tolerated Tetracaine 04/14/21 for LP.   Permethrin Shortness Of Breath   Sulfa Antibiotics Hives    Other reaction(s): Unknown   Xylitol Swelling    Swelling of arms, face    Amino Acids Other (See Comments)    Pt is avoiding these in her diet.    Bupivacaine     Other reaction(s): Other (See Comments), Other (See Comments) Felt out of it x 24 hours Felt out of it x 24 hours    Codeine     Doesn't work d/t metabolism problems. Doesn't work d/t metabolism problems.    Dairy Aid [Tilactase] Other (See Comments)  Reaction:  Stomach upset    Doxycycline Other (See Comments)    Reaction:  Vision issues    Hyaluronic Acid [Sodium Hyaluronate]     Gel- Injections    Indomethacin Other (See Comments)    Reaction:  Headaches that caused white matter in the brain to change.    Nickel     Other reaction(s): Other (See Comments) Green wrist after wearing a bracelet, pussy ears with gold and rash. Inflammation around gold tooth around her gum line.   Pneumococcal  Vaccines Swelling   Feldene [Piroxicam] Rash   Latex Rash   Milk [Milk (Cow)] Rash    HOME MEDICATIONS: Current Outpatient Medications  Medication Sig Dispense Refill   albuterol (PROVENTIL HFA;VENTOLIN HFA) 108 (90 BASE) MCG/ACT inhaler Inhale 2 puffs into the lungs every 6 (six) hours as needed for wheezing or shortness of breath.      butalbital-acetaminophen-caffeine (FIORICET) 50-325-40 MG tablet Take 1 tablet by mouth every 6 (six) hours as needed for headache. Take 1 tab at onset of migraine.  May repeat in 2 hrs, if needed.  Max dose: 2 tabs/day. This is a 30 day prescription. 10 tablet 3   EPINEPHrine 0.3 mg/0.3 mL IJ SOAJ injection      estradiol (ESTRACE) 0.1 MG/GM vaginal cream PLEASE SEE ATTACHED FOR DETAILED DIRECTIONS     VITAMIN D PO Take 5,000 Units by mouth.     acetaminophen (TYLENOL) 650 MG CR tablet Take by mouth.     CALCIUM PO Take 900 mg by mouth daily.     diphenhydrAMINE (SOMINEX) 25 MG tablet Take by mouth.     fexofenadine (ALLEGRA) 180 MG tablet Take by mouth.     ondansetron (ZOFRAN-ODT) 4 MG disintegrating tablet Take 1 tablet (4 mg total) by mouth every 8 (eight) hours as needed for nausea or vomiting. 20 tablet 3   polyethylene glycol powder (GLYCOLAX/MIRALAX) powder Take 17 g by mouth 2 (two) times daily.     Probiotic Product (PROBIOTIC PO) Take 1 tablet by mouth daily.     No current facility-administered medications for this visit.    PAST MEDICAL HISTORY: Past Medical History:  Diagnosis Date   Arthritis    Breast cancer ([facility])    Fibromyalgia    Fructose malabsorption    Hypermobile joints    Hypothyroid    Lymphedema    MTHFR (methylene THF reductase) deficiency and homocystinuria (HCC)    Small intestinal bacterial overgrowth (SIBO)    Venous malformation     PAST SURGICAL HISTORY: Past Surgical History:  Procedure Laterality Date   CYSTOCELE REPAIR     KNEE SURGERY     RECTOCELE REPAIR     TONSILLECTOMY     vaginal sling       FAMILY HISTORY: Family History  Problem Relation Age of Onset   Hypertension Mother    Heart failure Father    CAD Father    Atrial fibrillation Father    Hypertension Sister    Stroke Sister    Cancer Sister     SOCIAL HISTORY: Social History   Socioeconomic History   Marital status: Married    Spouse name: Not on file   Number of children: 3   Years of education: Not on file   Highest education level: Not on file  Occupational History   Occupation: retired  Tobacco Use   Smoking status: Never   Smokeless tobacco: Never  Vaping Use   Vaping Use: Never used  Substance and Sexual  Activity   Alcohol use: No   Drug use: No   Sexual activity: Never    Birth control/protection: None  Other Topics Concern   Not on file  Social History Narrative   Lives husband and daughter   Temporily staying with mom and dad who are assisting to take care of her, she can't stay at home by herself, daughter and husband work to much to take care of her   Right handed   Drinks no caffeine daily   Social Determinants of Health   Financial Resource Strain: Not on file  Food Insecurity: Not on file  Transportation Needs: Not on file  Physical Activity: Not on file  Stress: Not on file  Social Connections: Not on file  Intimate Partner Violence: Not on file       Marcial Pacas, M.D. Ph.D.  [facility] [address], [place], [place] [address] Ph: [phone] Fax: [phone]  CC:  [place] [address] [place],  [place] [address]  Tawni Carnes, MD

## 2022-08-30 NOTE — Telephone Encounter (Signed)
Pt is calling. Stated she was at the [facility] today because she having pain in right eye since last Thursday. Pt just want to let Dr. Krista Blue know what going on with her.

## 2022-09-14 ENCOUNTER — Encounter: Payer: Self-pay | Admitting: Neurology

## 2022-09-14 ENCOUNTER — Telehealth: Payer: Self-pay | Admitting: Neurology

## 2022-10-04 ENCOUNTER — Telehealth: Payer: Self-pay

## 2022-10-04 NOTE — Telephone Encounter (Signed)
Patient called and stated that you would see her PRN. She had a urine test and was told that she had low calcium and has pitting edema in her legs. Patient complains of feeling "spacey" and palpitation,  but no chest pain or dizziness. Would you like for her to come in for an appointment? If so, how soon. Please advise.

## 2022-10-04 NOTE — Telephone Encounter (Signed)
Sure. Can see at next available.  Thanks MJP

## 2022-10-11 ENCOUNTER — Ambulatory Visit: Payer: Managed Care, Other (non HMO) | Admitting: Cardiology

## 2022-10-11 ENCOUNTER — Other Ambulatory Visit: Payer: Managed Care, Other (non HMO)

## 2022-10-11 ENCOUNTER — Encounter: Payer: Self-pay | Admitting: Cardiology

## 2022-10-11 VITALS — BP 136/90 | HR 77 | Resp 16 | Ht 64.0 in | Wt 183.2 lb

## 2022-10-11 DIAGNOSIS — R600 Localized edema: Secondary | ICD-10-CM | POA: Insufficient documentation

## 2022-10-11 DIAGNOSIS — R002 Palpitations: Secondary | ICD-10-CM

## 2022-10-11 DIAGNOSIS — R42 Dizziness and giddiness: Secondary | ICD-10-CM

## 2022-10-11 NOTE — Progress Notes (Signed)
Patient referred by Tawni Carnes, MD for chest tightness, dizziness  Subjective:   Vanessa Porter, female    DOB: 1965-11-11, 57 y.o.   MRN: [id number]   Chief Complaint  Patient presents with   Palpitations   Follow-up     HPI  57 y.o. Caucasian female with mixed hyperlipidemia, family history of CAD, h/o breast cancer, fibromyalgia, frontal lobe venous malformation, GERD, dysphonia, recurrent syncope  Patient recently had multiple different vitamin supplements PO and IV through a functional medicine practitioner. Subsequently. She developed itching all over her body, requiring a course of steroid. Around the same time, she started noticing swelling in her legs R>L. She is also seeing a vein specialist and was supposed to get gadolinium contrast MRI but apparently also has gadolinium allergy? So she is quite frustrated. She has also had palpitations, more frequently than usual.   Initial consultation visit 08/2021: Patient has undergone multiple recent work-ups regarding dizziness and vestibular dysfunction.  This is reportedly been unrevealing.  She reports lightheadedness, standing up, feels like her head is unremarkable.  Separately, she has also had work-up for chest pain with multiple prior ER visits and stress test, which have been all negative.  Chest pain is unrelated to exertion.  it usually is related to eating.     Current Outpatient Medications:    butalbital-acetaminophen-caffeine (FIORICET) 50-325-40 MG tablet, Take 1 tablet by mouth every 6 (six) hours as needed for headache. Take 1 tab at onset of migraine.  May repeat in 2 hrs, if needed.  Max dose: 2 tabs/day. This is a 30 day prescription., Disp: 10 tablet, Rfl: 3   cyanocobalamin (VITAMIN B12) 1000 MCG/ML injection, Inject 100 mcg into the muscle every 30 (thirty) days., Disp: , Rfl:    EPINEPHrine 0.3 mg/0.3 mL IJ SOAJ injection, , Disp: , Rfl:    estradiol (ESTRACE) 0.1 MG/GM vaginal cream, PLEASE SEE  ATTACHED FOR DETAILED DIRECTIONS, Disp: , Rfl:    Vitamin D-Vitamin K (VITAMIN K2-VITAMIN D3 PO), Take by mouth., Disp: , Rfl:    albuterol (PROVENTIL HFA;VENTOLIN HFA) 108 (90 BASE) MCG/ACT inhaler, Inhale 2 puffs into the lungs every 6 (six) hours as needed for wheezing or shortness of breath.  (Patient not taking: Reported on 10/11/2022), Disp: , Rfl:    folic acid (FOLVITE) 1 MG tablet, Take 3 tablets by mouth daily., Disp: , Rfl:   Cardiovascular and other pertinent studies:  EKG 10/11/2022: Sinus rhythm 75 bpm  Normal EKG  Mobile cardiac telemetry 14 days 09/03/2021 - 09/17/2021: Dominant rhythm: Sinus. HR 40-140 bpm. Avg HR 74 bpm. 29 episodes of SVT, fastest at 133 bpm for 6 beats, longest for 18 beats at 108 bpm. <1% isolated SVE, couplet/triplets. 0 episodes of VT. <1% isolated VE, couplets. No atrial fibrillation/atrial flutter/VT/high grade AV block, sinus pause >3sec noted. 33 patient triggered events, most correlated with sinus rhythm/sinus tachcyardia without any arrhythmia Occasional episodes correlated with SVE/VE. SVT episodes did not correlate with reported symptoms.   Mobile cardiac telemetry 14 days 08/12/2021 - 08/26/2021: Dominant rhythm:Sinus. HR 41-143 bpm. Avg HR 76 bpm. 37 episodes of probable atrial tachycardia, fastest at 141 bpm for 4 beats, longest for 19 beats at 111 bpm. True duration of Supraventricular Tachycardia difficult to ascertain due to artifact.  <1% isolated SVE, couplet/triplets. 0 episodes of VT <1% isolated VE, couplets. No triplets. No atrial fibrillation/atrial flutter/VT/high grade AV block, sinus pause >3sec noted. 11 patient triggered events, a few correlated with SVE.  EKG 08/12/2021:  Sinus  Rhythm  RSR(V1) -nondiagnostic.  Nonspecific T-abnormality.   CT cardiac scoring 09/01/2021: Coronary calcium score of 0.   Recent labs: 07/2021: Glucose 92, BUN/Cr 15/0.72. EGFR >90. Na/K 141/3.9. Rest of the CMP normal H/H 14/42. MCV 86.  Platelets 212  04/23/2021: Glucose 103, BUN/Cr 22/1.0. EGFR >60. Na/K 137/4.4. Rest of the CMP normal H/H 14/43. MCV 88. Platelets 268 HbA1C 5.8% Chol 216, TG 52, HDL 64, LDL 142 TSH 2.3 normal ANA +ve   Review of Systems  Cardiovascular:  Positive for leg swelling. Negative for chest pain, dyspnea on exertion, palpitations and syncope.  Neurological:  Positive for dizziness and light-headedness.         Vitals:   10/11/22 1544 10/11/22 1545  BP:    Pulse:    Resp:    SpO2: 97% 97%    Orthostatic VS for the past 72 hrs (Last 3 readings):  Orthostatic BP Patient Position BP Location Cuff Size Orthostatic Pulse  10/11/22 1545 110/64 Standing Left Arm Large 86  10/11/22 1544 120/74 Sitting Left Arm Large 83  10/11/22 1542 128/78 Supine Left Arm Large 86    Body mass index is 31.45 kg/m. Filed Weights   10/11/22 1540  Weight: 183 lb 3.2 oz (83.1 kg)     Objective:   Physical Exam Vitals and nursing note reviewed.  Constitutional:      General: She is not in acute distress. Neck:     Vascular: No JVD.  Cardiovascular:     Rate and Rhythm: Normal rate and regular rhythm.     Pulses: Normal pulses.     Heart sounds: Normal heart sounds. No murmur heard. Pulmonary:     Effort: Pulmonary effort is normal.     Breath sounds: Normal breath sounds. No wheezing or rales.  Musculoskeletal:     Right lower leg: Edema (2+) present.     Left lower leg: Edema (1+) present.        Assessment & Recommendations:   57 y.o. Caucasian female with mixed hyperlipidemia, family history of CAD, h/o breast cancer, fibromyalgia, frontal lobe venous malformation, GERD, dysphonia, recurrent syncope  Dizziness, lightheadedness: Persists even without orthostatic hypotension.  I do not think her dizziness is cardiac in etiology.   Elevated blood pressure, leg edema: Possibly related to recent steroid use. If no improvement in next four weeks, could add low dose lasix. Until then,  consider wearing compression stockings.   Palpitations: Last a year ago. Will repeat.   F/u in 3 months  Time spent: 30 min   Nigel Mormon, MD Pager: [phone] Office: [phone]

## 2022-11-03 ENCOUNTER — Telehealth: Payer: Self-pay

## 2022-11-03 NOTE — Telephone Encounter (Signed)
LMOVM

## 2022-11-03 NOTE — Telephone Encounter (Signed)
Pt calling to check status of heart monitor that was mailed about 2 weeks ago

## 2022-11-03 NOTE — Telephone Encounter (Signed)
Monitor was normal, MP sent a message to mychart, also left VM for pt to call back

## 2022-11-08 ENCOUNTER — Telehealth: Payer: Self-pay

## 2022-11-08 NOTE — Telephone Encounter (Signed)
I reviewed [facility] notes. I agree with their assessment that this is benign positional vertigo and they have recommended vestibular physical therapy. Monitor results do not explain her symptoms. I really don't have any other cardiac testing to offer.   Thanks MJP

## 2022-11-08 NOTE — Telephone Encounter (Signed)
Patient still having vertigo. She was having a massage and when the therapist massaged her jaw and neck she had an episode of vertigo. She has seen ENT at [facility] crystals in right ear is off, she is receiving treatment but this is not resolving itself.  She would like for you look at the notes from [place]. She also wants to know if there was anything seen on the monitor the could explain the episodes of dizziness. If this treatment doesn't resolve this issue she would like further testing from you.

## 2022-11-10 NOTE — Telephone Encounter (Signed)
Called patient to inform her about the message above patient understood

## 2022-12-17 ENCOUNTER — Telehealth: Payer: Self-pay

## 2022-12-17 ENCOUNTER — Other Ambulatory Visit (HOSPITAL_COMMUNITY): Payer: Self-pay

## 2022-12-17 NOTE — Telephone Encounter (Signed)
Patient Advocate Encounter  Prior authorization for Ubrelvy 50MG  tablets submitted and APPROVED through [employer].  Test billing returns $0.00 copay for 30 day supply. Quantity 16 tablets per 30 days.  Key [id number] Effective: 12-17-2022 - 12-17-2023

## 2022-12-20 NOTE — Telephone Encounter (Signed)
Call to patient to make aware that Vanessa Porter was approved, advised on medication use. She states that she doesn't think she needs but will pick up just in case things changes but reports relief from Fioricet and reports overall more tolerable and manageable. Advised last recommendation was repeat MRI on 04/2023 per Dr. Terrace Arabia last note and she will call  back to schedule office visit.

## 2022-12-20 NOTE — Telephone Encounter (Signed)
Bernita Raisin as needed for rescue, Fioricet only one time Rx,

## 2023-01-12 ENCOUNTER — Ambulatory Visit: Payer: Managed Care, Other (non HMO) | Admitting: Cardiology

## 2023-01-12 ENCOUNTER — Encounter: Payer: Self-pay | Admitting: Cardiology

## 2023-01-12 VITALS — BP 133/72 | HR 64 | Resp 97 | Ht 64.0 in | Wt 179.8 lb

## 2023-01-12 DIAGNOSIS — R42 Dizziness and giddiness: Secondary | ICD-10-CM

## 2023-01-12 NOTE — Progress Notes (Signed)
Patient referred by Erlene Senters, MD for chest tightness, dizziness  Subjective:   Vanessa Porter, female    DOB: 19-Jan-1966, 57 y.o.   MRN: [id number]   Chief Complaint  Patient presents with   Palpitations   Follow-up     HPI  57 y.o. Caucasian female with mixed hyperlipidemia, family history of CAD, h/o breast cancer, fibromyalgia, frontal lobe venous malformation, GERD, dysphonia, recurrent syncope  Patient continues to have positional dizziness, for which she is undergoing vestibular therapy. She is also seeing vascular surgery at [facility] for what appears to be venous insufficiency.  Initial consultation visit 08/2021: Patient has undergone multiple recent work-ups regarding dizziness and vestibular dysfunction.  This is reportedly been unrevealing.  She reports lightheadedness, standing up, feels like her head is unremarkable.  Separately, she has also had work-up for chest pain with multiple prior ER visits and stress test, which have been all negative.  Chest pain is unrelated to exertion.  it usually is related to eating.     Current Outpatient Medications:    albuterol (PROVENTIL HFA;VENTOLIN HFA) 108 (90 BASE) MCG/ACT inhaler, Inhale 2 puffs into the lungs every 6 (six) hours as needed for wheezing or shortness of breath.  (Patient not taking: Reported on 10/11/2022), Disp: , Rfl:    butalbital-acetaminophen-caffeine (FIORICET) 50-325-40 MG tablet, Take 1 tablet by mouth every 6 (six) hours as needed for headache. Take 1 tab at onset of migraine.  May repeat in 2 hrs, if needed.  Max dose: 2 tabs/day. This is a 30 day prescription., Disp: 10 tablet, Rfl: 3   cyanocobalamin (VITAMIN B12) 1000 MCG/ML injection, Inject 100 mcg into the muscle every 30 (thirty) days., Disp: , Rfl:    EPINEPHrine 0.3 mg/0.3 mL IJ SOAJ injection, , Disp: , Rfl:    estradiol (ESTRACE) 0.1 MG/GM vaginal cream, PLEASE SEE ATTACHED FOR DETAILED DIRECTIONS, Disp: , Rfl:    folic acid (FOLVITE) 1 MG  tablet, Take 3 tablets by mouth daily., Disp: , Rfl:    GLYCINE PO, Take 1 g by mouth 3 (three) times daily., Disp: , Rfl:    MAGNESIUM GLYCINATE PO, Take 200 mg by mouth 3 (three) times daily., Disp: , Rfl:    NON FORMULARY, Take 1 capsule by mouth daily. B-, Disp: , Rfl:    NON FORMULARY, Take 1 tablet by mouth daily. Mangenese 8mg , Disp: , Rfl:    Vitamin D-Vitamin K (VITAMIN K2-VITAMIN D3 PO), Take by mouth., Disp: , Rfl:   Cardiovascular and other pertinent studies:  EKG 10/11/2022: Sinus rhythm 75 bpm  Normal EKG  Mobile cardiac telemetry 14 days 09/03/2021 - 09/17/2021: Dominant rhythm: Sinus. HR 40-140 bpm. Avg HR 74 bpm. 29 episodes of SVT, fastest at 133 bpm for 6 beats, longest for 18 beats at 108 bpm. <1% isolated SVE, couplet/triplets. 0 episodes of VT. <1% isolated VE, couplets. No atrial fibrillation/atrial flutter/VT/high grade AV block, sinus pause >3sec noted. 33 patient triggered events, most correlated with sinus rhythm/sinus tachcyardia without any arrhythmia Occasional episodes correlated with SVE/VE. SVT episodes did not correlate with reported symptoms.   Mobile cardiac telemetry 14 days 08/12/2021 - 08/26/2021: Dominant rhythm:Sinus. HR 41-143 bpm. Avg HR 76 bpm. 37 episodes of probable atrial tachycardia, fastest at 141 bpm for 4 beats, longest for 19 beats at 111 bpm. True duration of Supraventricular Tachycardia difficult to ascertain due to artifact.  <1% isolated SVE, couplet/triplets. 0 episodes of VT <1% isolated VE, couplets. No triplets. No atrial fibrillation/atrial flutter/VT/high grade  AV block, sinus pause >3sec noted. 11 patient triggered events, a few correlated with SVE.  EKG 08/12/2021: Sinus  Rhythm  RSR(V1) -nondiagnostic.  Nonspecific T-abnormality.   CT cardiac scoring 09/01/2021: Coronary calcium score of 0.   Recent labs: 07/2021: Glucose 92, BUN/Cr 15/0.72. EGFR >90. Na/K 141/3.9. Rest of the CMP normal H/H 14/42. MCV 86.  Platelets 212  04/23/2021: Glucose 103, BUN/Cr 22/1.0. EGFR >60. Na/K 137/4.4. Rest of the CMP normal H/H 14/43. MCV 88. Platelets 268 HbA1C 5.8% Chol 216, TG 52, HDL 64, LDL 142 TSH 2.3 normal ANA +ve   Review of Systems  Cardiovascular:  Negative for chest pain, dyspnea on exertion, leg swelling, palpitations and syncope.  Neurological:  Positive for dizziness and light-headedness.         Vitals:   01/12/23 1508 01/12/23 1509  BP:    Pulse:    Resp:  (!) 97  SpO2: 97%     Orthostatic VS for the past 72 hrs (Last 3 readings):  Orthostatic BP Patient Position BP Location Cuff Size Orthostatic Pulse  01/12/23 1509 130/74 Standing Left Arm Normal 75  01/12/23 1508 134/76 Sitting Left Arm Normal 65  01/12/23 1507 133/72 Supine Left Arm Normal 68    Body mass index is 30.86 kg/m. Filed Weights   01/12/23 1500  Weight: 179 lb 12.8 oz (81.6 kg)     Objective:   Physical Exam Vitals and nursing note reviewed.  Constitutional:      General: She is not in acute distress. Neck:     Vascular: No JVD.  Cardiovascular:     Rate and Rhythm: Normal rate and regular rhythm.     Pulses: Normal pulses.     Heart sounds: Normal heart sounds. No murmur heard. Pulmonary:     Effort: Pulmonary effort is normal.     Breath sounds: Normal breath sounds. No wheezing or rales.  Musculoskeletal:     Right lower leg: No edema.     Left lower leg: No edema.        Assessment & Recommendations:   57 y.o. Caucasian female with mixed hyperlipidemia, family history of CAD, h/o breast cancer, fibromyalgia, frontal lobe venous malformation, GERD, dysphonia, recurrent syncope  Dizziness, lightheadedness: Persists even without orthostatic hypotension, again checked today.  I do not think her dizziness is cardiac in etiology.  Continue vestibular therapy.   F/u as needed   Elder Negus, MD Pager: [phone] Office: [phone]

## 2023-01-26 ENCOUNTER — Telehealth: Payer: Self-pay | Admitting: Neurology

## 2023-01-26 MED ORDER — BUTALBITAL-APAP-CAFFEINE 50-325-40 MG PO TABS: 1.0000 | ORAL_TABLET | Freq: Four times a day (QID) | ORAL | 3 refills | Status: AC | PRN
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
## 2023-01-26 NOTE — Telephone Encounter (Signed)
Fioricet as needed occasionally for headache will be okay

## 2023-01-26 NOTE — Addendum Note (Signed)
Addended by: Eather Colas E on: 01/26/2023 11:04 AM   Modules accepted: Orders
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
## 2023-01-26 NOTE — Telephone Encounter (Signed)
Can she take fioricet along with what she received at the hospital? Looks like given hydrocodone upon release.

## 2023-01-26 NOTE — Addendum Note (Signed)
Addended by: Levert Feinstein on: 01/26/2023 05:09 PM   Modules accepted: Orders
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
## 2023-02-24 ENCOUNTER — Telehealth: Payer: Self-pay | Admitting: Neurology

## 2023-02-24 ENCOUNTER — Ambulatory Visit (INDEPENDENT_AMBULATORY_CARE_PROVIDER_SITE_OTHER): Payer: Managed Care, Other (non HMO) | Admitting: Neurology

## 2023-02-24 ENCOUNTER — Encounter: Payer: Self-pay | Admitting: Neurology

## 2023-02-24 VITALS — BP 133/84 | HR 82 | Ht 64.0 in | Wt 179.0 lb

## 2023-02-24 DIAGNOSIS — G43709 Chronic migraine without aura, not intractable, without status migrainosus: Secondary | ICD-10-CM | POA: Diagnosis not present

## 2023-02-24 DIAGNOSIS — S060X0S Concussion without loss of consciousness, sequela: Secondary | ICD-10-CM | POA: Diagnosis not present

## 2023-02-24 DIAGNOSIS — S060XAA Concussion with loss of consciousness status unknown, initial encounter: Secondary | ICD-10-CM | POA: Insufficient documentation

## 2023-02-24 DIAGNOSIS — R9389 Abnormal findings on diagnostic imaging of other specified body structures: Secondary | ICD-10-CM | POA: Diagnosis not present

## 2023-02-24 NOTE — Telephone Encounter (Signed)
sent to [facility] they obtain [employer] auth [phone] 
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
## 2023-02-24 NOTE — Patient Instructions (Signed)
Very nice to meet you today.  I will order MRI of the brain.  Will update once results are available.  Thanks!!

## 2023-02-26 ENCOUNTER — Encounter: Payer: Self-pay | Admitting: Neurology

## 2023-02-28 ENCOUNTER — Telehealth: Payer: Self-pay | Admitting: Neurology
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
## 2023-03-17 ENCOUNTER — Other Ambulatory Visit: Payer: Managed Care, Other (non HMO)

## 2023-04-05 ENCOUNTER — Inpatient Hospital Stay: Admission: RE | Admit: 2023-04-05 | Payer: Managed Care, Other (non HMO) | Source: Ambulatory Visit

## 2023-04-12 ENCOUNTER — Other Ambulatory Visit: Payer: Self-pay | Admitting: Orthopedic Surgery

## 2023-04-12 DIAGNOSIS — M25562 Pain in left knee: Secondary | ICD-10-CM

## 2023-04-13 ENCOUNTER — Ambulatory Visit
Admission: RE | Admit: 2023-04-13 | Discharge: 2023-04-13 | Discharge status: Home / Self Care | Payer: Managed Care, Other (non HMO) | Source: Ambulatory Visit | Attending: Neurology | Admitting: Neurology

## 2023-04-13 DIAGNOSIS — R9389 Abnormal findings on diagnostic imaging of other specified body structures: Secondary | ICD-10-CM | POA: Diagnosis not present

## 2023-04-14 ENCOUNTER — Ambulatory Visit
Admission: RE | Admit: 2023-04-14 | Discharge: 2023-04-14 | Disposition: A | Discharge status: Home / Self Care | Payer: Managed Care, Other (non HMO) | Source: Ambulatory Visit | Attending: Orthopedic Surgery | Admitting: Orthopedic Surgery

## 2023-04-14 DIAGNOSIS — M25562 Pain in left knee: Secondary | ICD-10-CM

## 2023-04-18 ENCOUNTER — Telehealth: Payer: Self-pay | Admitting: Neurology

## 2023-04-18 NOTE — Telephone Encounter (Signed)
Reviewed and compared with previous MRIs, agree, findings are stable, treat her complains/symptoms only

## 2023-04-18 NOTE — Telephone Encounter (Signed)
Dr. Terrace Arabia, would you please review Ms. Druck's MRI brain before I release it to her?  Based on report, appears stable , with specific mention of stable right frontal hyperintensity adjacent to right venous anomaly. Do you have any other suggestions needed for MRI?

## 2023-06-09 ENCOUNTER — Encounter (INDEPENDENT_AMBULATORY_CARE_PROVIDER_SITE_OTHER): Payer: Self-pay

## 2023-08-28 IMAGING — MR MR LUMBAR SPINE WO/W CM
4 of 7 series · 19 of 48 positions shown · IV contrast (gadavist)
Comparison: None.

CLINICAL DATA: Low back pain, suspected cauda equina syndrome.
Recent epidural blood patch for CSF leak following lumbar puncture

EXAM:
MRI LUMBAR SPINE WITHOUT AND WITH CONTRAST
TECHNIQUE: Multiplanar and multiecho pulse sequences of the lumbar spine were
obtained without and with intravenous contrast.
CONTRAST:  7.5mL GADAVIST GADOBUTROL 1 MMOL/ML IV SOLN

[Series 4: T2 · sagittal · 4.0mm · 0.55mm/px · 4 of 15 slices shown (1 of 2)]
[im 1/15]
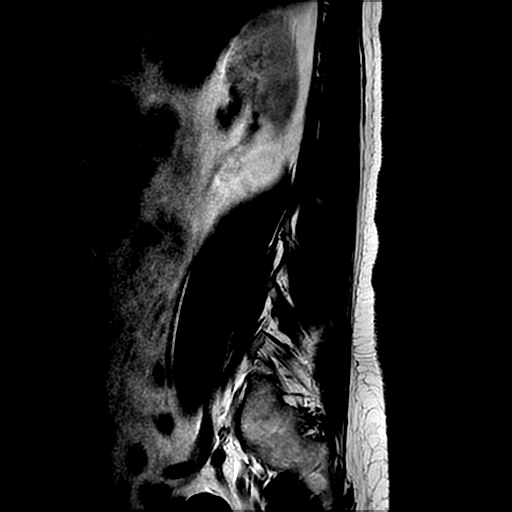
[im 5/15]
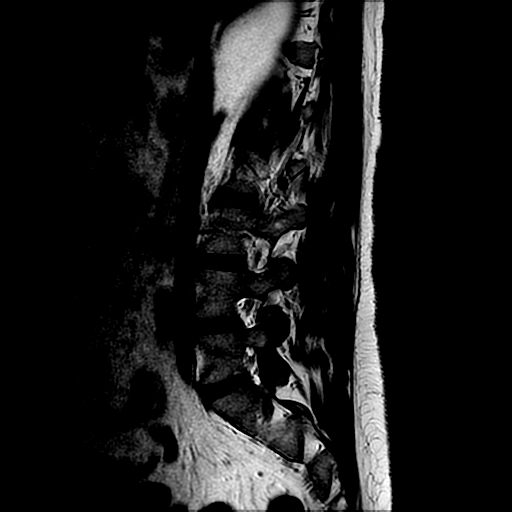
[im 10/15]
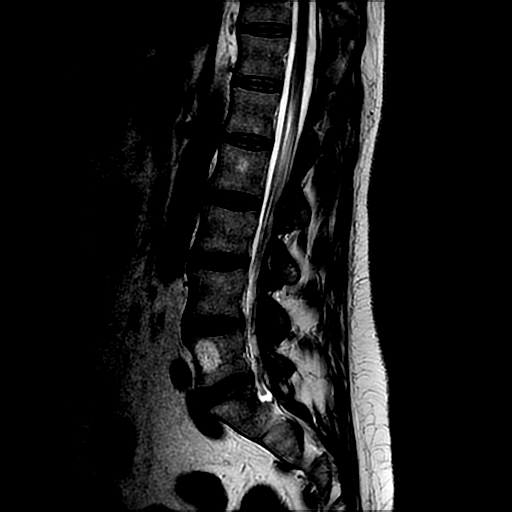
[im 15/15]
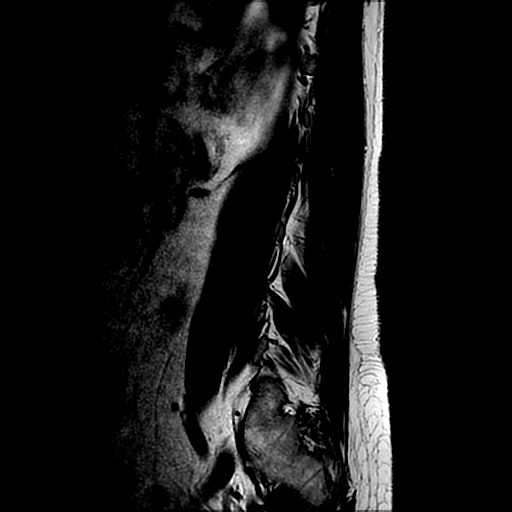

[Series 5: T1 · sagittal · 4.0mm · 0.55mm/px · 4 of 15 slices shown (1 of 2)]
[im 1/15]
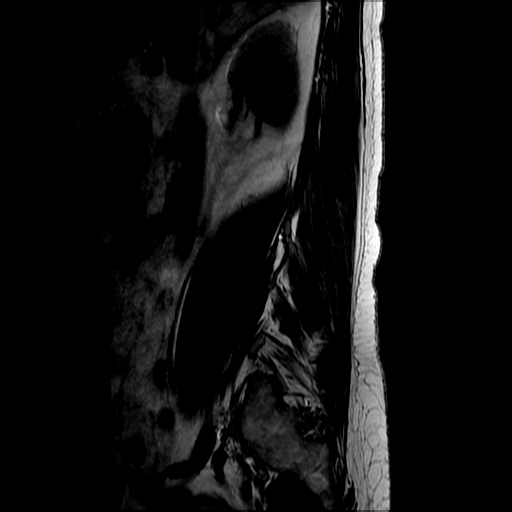
[im 5/15]
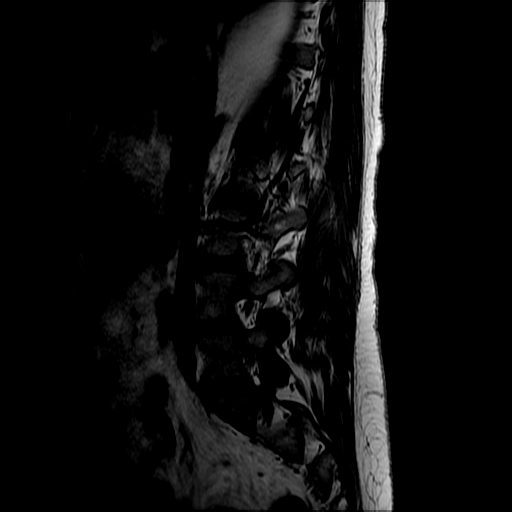
[im 10/15]
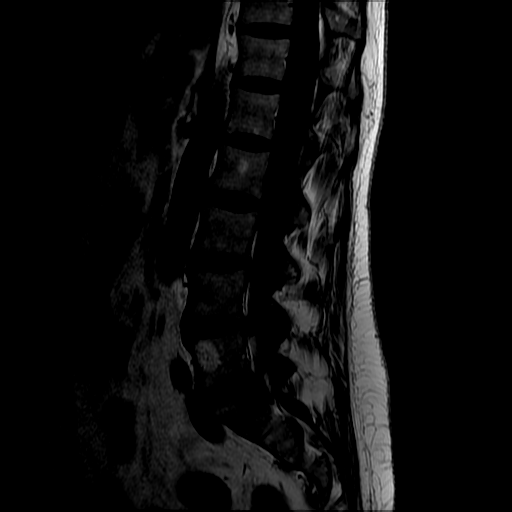
[im 15/15]
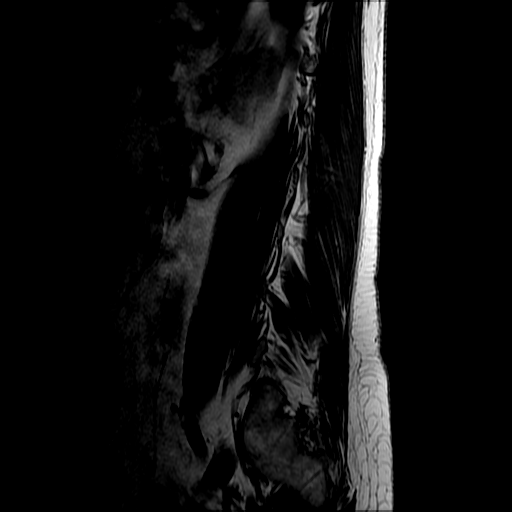

[Series 7: T2 · axial · 4.0mm · 0.39mm/px · z∈[-182,-13]mm · 8 of 32 slices shown (2 of 2)]
[im 1/32]
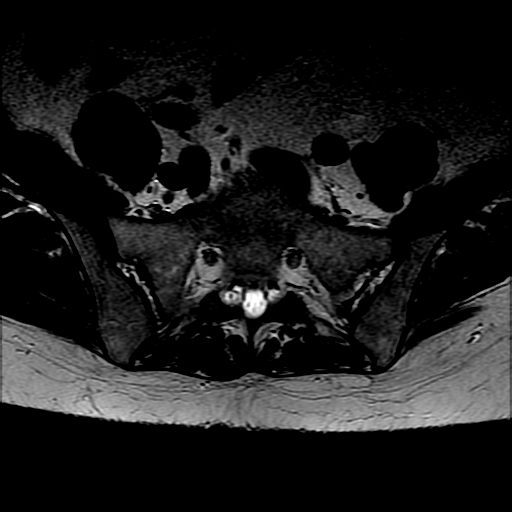
[im 4/32]
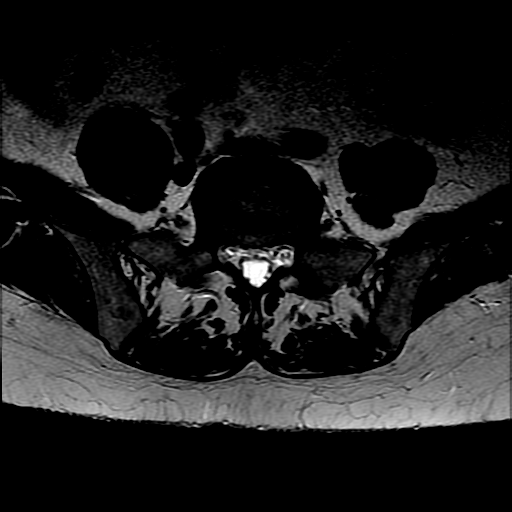
[im 11/32]
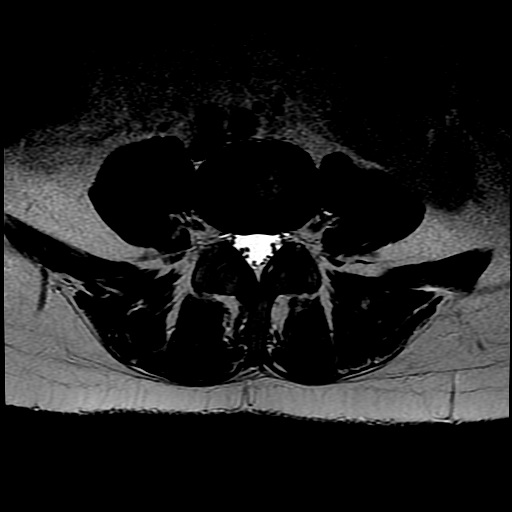
[im 14/32]
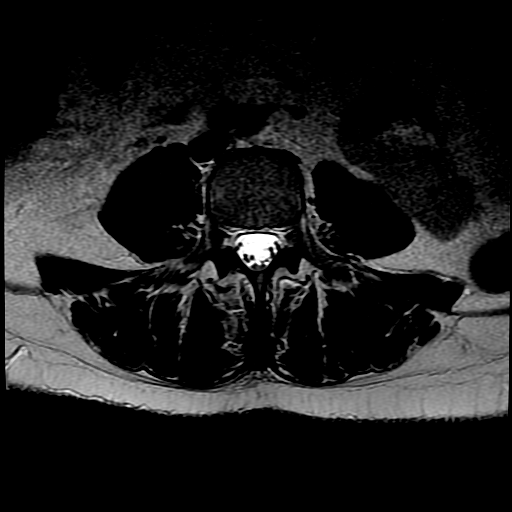
[im 18/32]
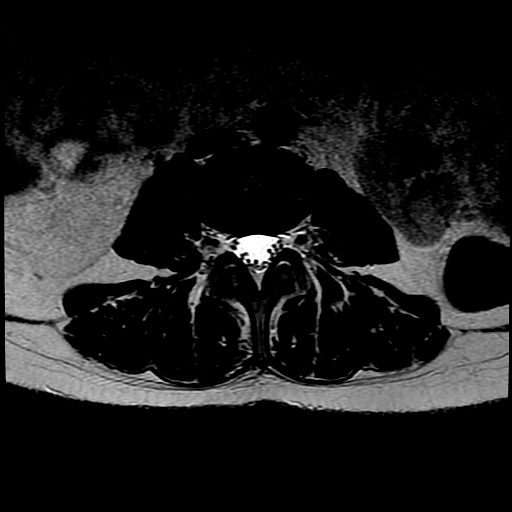
[im 21/32]
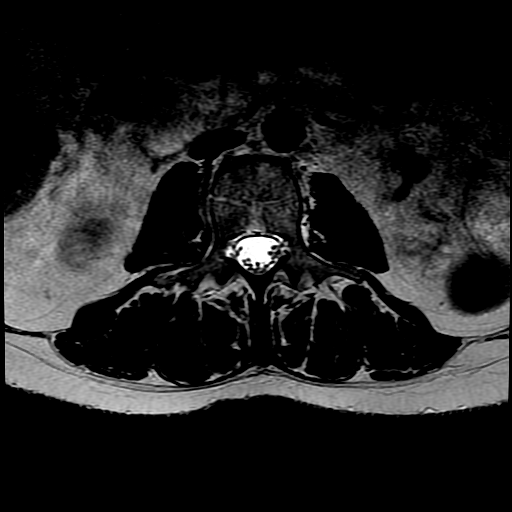
[im 28/32]
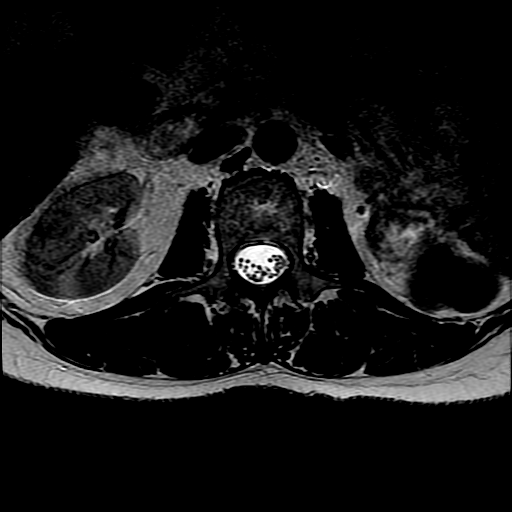
[im 32/32]
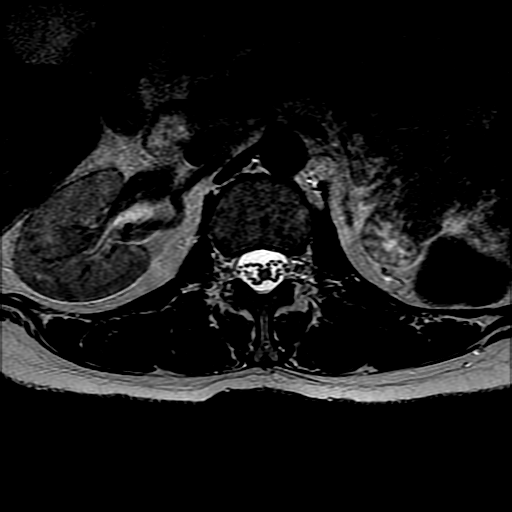

[Series 8: T1 · axial · 4.0mm · 0.39mm/px · z∈[-167,-33]mm · 3 of 32 slices shown (2 of 2)]
[im 4/32]
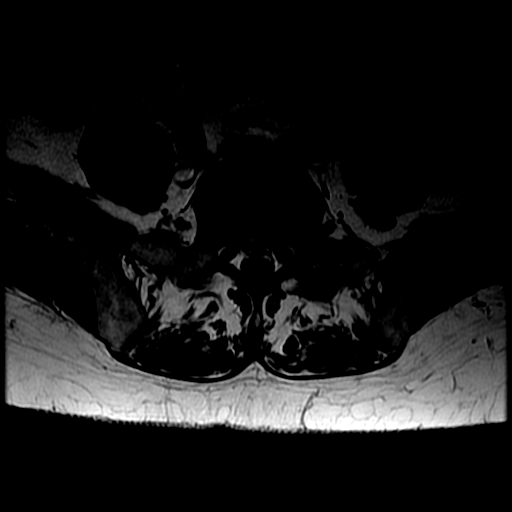
[im 18/32]
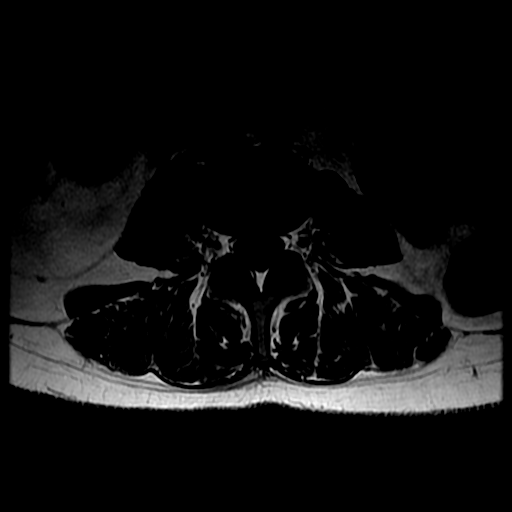
[im 28/32]
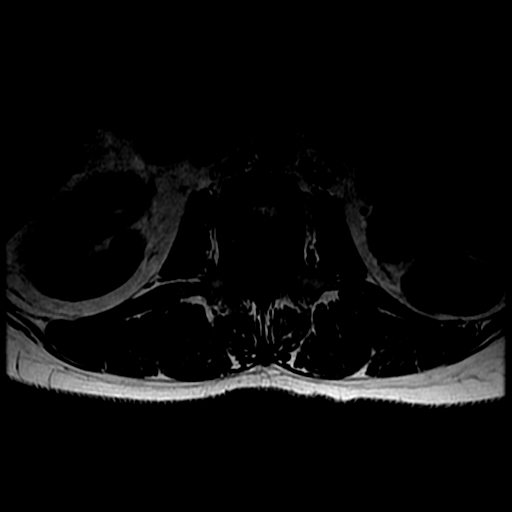

[19 of 48 positions shown; findings below may reference images not displayed]

FINDINGS: Segmentation: Standard; the lowest formed disc space is designated
L5-S1.

Alignment:  Normal

Vertebrae: There are intraosseous hemangiomas in the L2 and L5
vertebral bodies. Marrow signal is otherwise normal. There is no
suspicious marrow signal abnormality. There is no abnormal
enhancement.

Conus medullaris and cauda equina: Conus extends to the mid L2
level. Conus and cauda equina appear normal. There is no abnormal
enhancement.

Paraspinal and other soft tissues: Mild STIR signal abnormality in
the posterior soft tissues at L3-L4 is likely related to the recent
procedure. The paraspinal soft tissues are otherwise unremarkable.
There is no pseudomeningocele. The left kidney is malrotated and low
lying. A 2.2 cm T2 hyperintense lesion in the left lower pole likely
reflects a cyst.

Disc levels:

There is minimal disc desiccation without significant loss of height
throughout the lumbar spine. There is minimal facet arthropathy in
the lower lumbar spine.

There is no evidence of disc herniation. There is no spinal canal or
neural foraminal stenosis. There is no evidence of nerve root
impingement.
IMPRESSION: Essentially normal MRI of the lumbar spine. No evidence of nerve
root impingement or pseudomeningocele.

## 2023-11-21 ENCOUNTER — Telehealth: Payer: Self-pay | Admitting: Cardiology

## 2023-11-21 NOTE — Telephone Encounter (Signed)
 Patient is calling stating she received a call around 2:10 pm, but no VM was left. She is unsure if it was for her or someone else within her family she takes calls for. Patient gave 3 other names and DOB's, but I did not see any documentation of who was calling or why.  Please advise.

## 2023-12-05 ENCOUNTER — Other Ambulatory Visit (HOSPITAL_COMMUNITY): Payer: Self-pay

## 2023-12-19 ENCOUNTER — Telehealth: Payer: Self-pay | Admitting: Neurology

## 2023-12-19 NOTE — Telephone Encounter (Signed)
 Agree for her to see pcp first

## 2023-12-19 NOTE — Telephone Encounter (Signed)
 Call to patient,  she reports having "pop" in her brain and then felt dizzy. She was singing with her choir when this happened. She has reported a headache since and being swimmy headed. She reports a friend that is ER nurse checking her out and denied any stroke symptoms. I advised I would send to Dr. Gracie Lav but she should follow up with PCP since this is new. Patient requests not to see Jeanmarie Millet, NP

## 2023-12-19 NOTE — Telephone Encounter (Signed)
 Pt called needing to speak to a nurse about this sudden Headache that started this weekend and still continues now. Please advise

## 2023-12-21 ENCOUNTER — Telehealth: Payer: Self-pay

## 2023-12-21 NOTE — Telephone Encounter (Signed)
 Last seen July 2024, prefer to see her again for discussion, virtual or in person

## 2023-12-21 NOTE — Telephone Encounter (Signed)
 Pt called in and stated that she has to take 4 rounds Rifaximin mg TID for 14 days  throughout the year and would like to know if its safe to continue taking ubrelvy . She saw a warning for cyp3af inducers should be avoided will result in reduction of ubrelvy  exposure. Basically meaning the efficacy of ubrelvy  will be affected. Dosage modifactions are recommended for it based on the patients investigation of ubrelvy .    Pt wants to make sarah aware to check genetic profile before prescribing anything new such as liver pathways.   Pt was wondering if qulipta would be an option

## 2023-12-21 NOTE — Telephone Encounter (Signed)
 Pt would like a call back having stomach pain, diarrhea, headaches since Sunday. Need to discuss drugs used together. Have more information on how much drug exposure in my body.

## 2023-12-22 NOTE — Telephone Encounter (Signed)
 Called and spoke to pt and was able to offer a virtual visit

## 2023-12-23 ENCOUNTER — Telehealth: Payer: Self-pay | Admitting: Neurology

## 2023-12-23 NOTE — Telephone Encounter (Signed)
 Pt called asking for an appointment for a new diagnosis.  Phone rep informed pt every diagnosis requires a referral.

## 2023-12-27 ENCOUNTER — Telehealth (INDEPENDENT_AMBULATORY_CARE_PROVIDER_SITE_OTHER): Admitting: Neurology

## 2023-12-27 DIAGNOSIS — R9389 Abnormal findings on diagnostic imaging of other specified body structures: Secondary | ICD-10-CM | POA: Diagnosis not present

## 2023-12-27 DIAGNOSIS — G43709 Chronic migraine without aura, not intractable, without status migrainosus: Secondary | ICD-10-CM

## 2023-12-27 MED ORDER — ONDANSETRON 4 MG PO TBDP: 4.0000 mg | ORAL_TABLET | Freq: Three times a day (TID) | ORAL | 6 refills | Status: AC | PRN

## 2023-12-27 MED ORDER — TIZANIDINE HCL 4 MG PO TABS
4.0000 mg | ORAL_TABLET | Freq: Four times a day (QID) | ORAL | 3 refills | Status: AC | PRN
Start: 2023-12-27 — End: ?
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
## 2023-12-27 NOTE — Patient Instructions (Addendum)
 Meds ordered this encounter  Medications   ondansetron  (ZOFRAN -ODT) 4 MG disintegrating tablet    Sig: Take 1 tablet (4 mg total) by mouth every 8 (eight) hours as needed for nausea or vomiting.    Dispense:  20 tablet    Refill:  6   tiZANidine (ZANAFLEX) 4 MG tablet    Sig: Take 1 tablet (4 mg total) by mouth every 6 (six) hours as needed for muscle spasms.    Dispense:  30 tablet    Refill:  3     You may take Ubrelvy ,  Mortin, zofran  and Tizanidine for prolonged severe headaches,
